# Patient Record
Sex: Female | Born: 1980 | Race: White | Hispanic: No | Marital: Married | Smoking: Never smoker
Health system: Southern US, Community
[De-identification: ages and names within clinical notes are randomized; demographics above are authoritative.]

## PROBLEM LIST (undated history)

## (undated) ENCOUNTER — Inpatient Hospital Stay (HOSPITAL_COMMUNITY): Payer: Self-pay

## (undated) DIAGNOSIS — L089 Local infection of the skin and subcutaneous tissue, unspecified: Secondary | ICD-10-CM

## (undated) DIAGNOSIS — F410 Panic disorder [episodic paroxysmal anxiety] without agoraphobia: Secondary | ICD-10-CM

## (undated) DIAGNOSIS — R030 Elevated blood-pressure reading, without diagnosis of hypertension: Secondary | ICD-10-CM

## (undated) DIAGNOSIS — M26629 Arthralgia of temporomandibular joint, unspecified side: Secondary | ICD-10-CM

## (undated) DIAGNOSIS — J0301 Acute recurrent streptococcal tonsillitis: Secondary | ICD-10-CM

## (undated) DIAGNOSIS — B958 Unspecified staphylococcus as the cause of diseases classified elsewhere: Secondary | ICD-10-CM

---
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
## 2006-08-22 ENCOUNTER — Other Ambulatory Visit: Admission: RE | Admit: 2006-08-22 | Discharge: 2006-08-22 | Payer: Self-pay | Admitting: *Deleted

## 2008-12-22 ENCOUNTER — Ambulatory Visit: Payer: Self-pay | Admitting: Sports Medicine

## 2008-12-22 DIAGNOSIS — S93499A Sprain of other ligament of unspecified ankle, initial encounter: Secondary | ICD-10-CM

## 2008-12-22 DIAGNOSIS — S96819A Strain of other specified muscles and tendons at ankle and foot level, unspecified foot, initial encounter: Secondary | ICD-10-CM

## 2008-12-22 DIAGNOSIS — M25579 Pain in unspecified ankle and joints of unspecified foot: Secondary | ICD-10-CM

## 2008-12-22 DIAGNOSIS — M2170 Unequal limb length (acquired), unspecified site: Secondary | ICD-10-CM

## 2008-12-22 DIAGNOSIS — M2140 Flat foot [pes planus] (acquired), unspecified foot: Secondary | ICD-10-CM | POA: Insufficient documentation

## 2009-01-17 ENCOUNTER — Ambulatory Visit: Payer: Self-pay | Admitting: Sports Medicine

## 2009-02-24 ENCOUNTER — Ambulatory Visit: Payer: Self-pay | Admitting: Sports Medicine

## 2009-09-09 HISTORY — PX: COLPOSCOPY: SHX161

## 2010-02-12 ENCOUNTER — Ambulatory Visit: Payer: Self-pay | Admitting: Family Medicine

## 2010-02-12 DIAGNOSIS — M76899 Other specified enthesopathies of unspecified lower limb, excluding foot: Secondary | ICD-10-CM | POA: Insufficient documentation

## 2010-10-09 NOTE — Assessment & Plan Note (Signed)
Summary: TO SEE BAILEY,U/S CALF,MC   Vital Signs:  Patient profile:   30 year old female Height:      62 inches Weight:      110 pounds BMI:     20.19 BP sitting:   110 / 76  Vitals Entered By: Lillia Pauls CMA (February 12, 2010 11:26 AM)  History of Present Illness: Went for a typical run 3 days ago. Felt a slight shearing sensation wrt medial aspect of musculotendionous junction of distal left calf. Did not experience a popping or rupture sensation. Stopped running to prevent further damage. No swelling but noted slight bruising which has resolved. No paresthesias. No prior left calf tear or procedures. No change in footwear, running course, or exercise activties.   Some pain on ambulation, which has notable decreased since her injury.   Notes some intermittent non-radiating pain along the  postero-lateral aspects of her hips. Pain develops during runs and worsens until she stops running. Accompanied by mid-buttock pain as well as runs progress. No resting pain. No prior hip injuries or procedures. No back pain.   Next marathon scheduled for October, 2011.  Physical Exam  General:  Well-developed,well-nourished,in no acute distress; alert,appropriate and cooperative throughout examination Msk:  HIPS: Full active ROM. No pain on ROM testing. Full strength except 4/5 R ER and L flex. Mildly (+) right trendelenburg. (+) ttp over right glut medius posterior to GT. No swelling. No ttp of R GT. (+) FABER on right.  LEFT CALF: No swelling, discoloration, or apparent defect. Moderate ttp medial MT junction of distal calf. No ttp elsewhere. Normal Thompson's.  ANKLES/FEET: Full active ROM with full strength throughout ROM testing. No pain throughout ROM testing.  GAIT/RUNNING FORM: Non-antalgic gait. Mild dynamic right trendelenburg on running. Extremities:  LLE <0.5 cm shorter than RLE. Neurologic:  (-) SLR bilaterally. Additional Exam:  Musculoskeletal  Ultrasound: Longitudinal and transverse views of the left achilles tendon revealed the following: Integrity: Normal Thickness: Normal Retrocalcaneal Bursa: Normal Calcaneus: Normal  No apparent defect or abnormal hypoechogenic fluid collection at MT junction where patient c/o pain. (+) dynamic ttp and mildly increased doppler flow at this site.    Impression & Recommendations:  Problem # 1:  LEG PAIN, LEFT (ICD-729.5) No evidence of calf or AT rupture. Possible mild partial tear at MT junction involving fibers or tendon sheath.  - No running. - Avoid inclines/declines. - Ambulate on level surfaces as tolerated. After pain on ambulation resolves, start calf strengthening exercises (patient instructions) as tolerated. - RTC in 3-4 wks or sooner as needed for persistent pain or any other concerns.  Problem # 2:  ENTHESOPATHY OF HIP REGION (ICD-726.5)  - Daily hip strengthening exercises, demonstrated to the patient. 120 repetitions/each daily. - RTC in 3 to 4 wks.  Complete Medication List: 1)  Voltaren 1 % Gel (Diclofenac sodium) .... Apply to affected area 2-3 times daily disp: one large tube  Patient Instructions: 1)  Do not start the following exercises until your pain goes away. 2)  Transition toward gentle heel raises on a flat surface as tolerated, up to 30 times daily. 3)  Then start pidgeon-toe heel raises on a flat surface as tolerated, up to 30 times daily. 4)  Then start toe-walks across the room, up to 10 times daily. 5)  Then start using the recumbent bike as tolerated. 6)  Then start using the elliptical as tolerated. 7)  See [place] in 3 to 4 weeks. Call us for any questions  or concerns.

## 2011-03-28 ENCOUNTER — Encounter (HOSPITAL_COMMUNITY): Payer: Self-pay | Admitting: *Deleted

## 2011-03-29 ENCOUNTER — Ambulatory Visit (HOSPITAL_COMMUNITY): Payer: 59 | Admitting: Anesthesiology

## 2011-03-29 ENCOUNTER — Encounter (HOSPITAL_COMMUNITY): Payer: Self-pay | Admitting: Anesthesiology

## 2011-03-29 ENCOUNTER — Ambulatory Visit (HOSPITAL_COMMUNITY): Payer: 59

## 2011-03-29 ENCOUNTER — Ambulatory Visit (HOSPITAL_COMMUNITY)
Admission: RE | Admit: 2011-03-29 | Discharge: 2011-03-29 | Disposition: A | Discharge status: Home / Self Care | Payer: 59 | Source: Ambulatory Visit | Attending: Obstetrics & Gynecology | Admitting: Obstetrics & Gynecology

## 2011-03-29 ENCOUNTER — Encounter (HOSPITAL_COMMUNITY)
Admission: RE | Disposition: A | Discharge status: Home / Self Care | Payer: Self-pay | Source: Ambulatory Visit | Attending: Obstetrics & Gynecology

## 2011-03-29 ENCOUNTER — Other Ambulatory Visit: Payer: Self-pay | Admitting: Obstetrics & Gynecology

## 2011-03-29 DIAGNOSIS — Z9889 Other specified postprocedural states: Secondary | ICD-10-CM

## 2011-03-29 DIAGNOSIS — O021 Missed abortion: Secondary | ICD-10-CM | POA: Insufficient documentation

## 2011-03-29 DIAGNOSIS — R634 Abnormal weight loss: Secondary | ICD-10-CM

## 2011-03-29 HISTORY — PX: DILATION AND EVACUATION: SHX1459

## 2011-03-29 LAB — CBC
HCT: 39.5 % (ref 36.0–46.0)
MCHC: 33.7 g/dL (ref 30.0–36.0)
MCV: 81.4 fL (ref 78.0–100.0)
Platelets: 225 10*3/uL (ref 150–400)
RDW: 13.2 % (ref 11.5–15.5)
WBC: 6 10*3/uL (ref 4.0–10.5)

## 2011-03-29 SURGERY — DILATION AND EVACUATION, UTERUS
Anesthesia: Monitor Anesthesia Care | Site: Uterus | Wound class: Clean Contaminated

## 2011-03-29 MED ORDER — MIDAZOLAM HCL 2 MG/2ML IJ SOLN
INTRAMUSCULAR | Status: AC
  Filled 2011-03-29: qty 2

## 2011-03-29 MED ORDER — LIDOCAINE HCL (CARDIAC) 20 MG/ML IV SOLN
INTRAVENOUS | Status: DC | PRN
  Administered 2011-03-29: 80 mg via INTRAVENOUS

## 2011-03-29 MED ORDER — CEFAZOLIN SODIUM 1-5 GM-% IV SOLN: 1.0000 g | INTRAVENOUS | Status: DC

## 2011-03-29 MED ORDER — FENTANYL CITRATE 0.05 MG/ML IJ SOLN
INTRAMUSCULAR | Status: DC | PRN
  Administered 2011-03-29: 100 ug via INTRAVENOUS

## 2011-03-29 MED ORDER — PROPOFOL 10 MG/ML IV EMUL
INTRAVENOUS | Status: AC
  Filled 2011-03-29: qty 20

## 2011-03-29 MED ORDER — CEFAZOLIN SODIUM 1-5 GM-% IV SOLN
INTRAVENOUS | Status: DC | PRN
  Administered 2011-03-29: 1 g via INTRAVENOUS

## 2011-03-29 MED ORDER — LIDOCAINE HCL (CARDIAC) 20 MG/ML IV SOLN
INTRAVENOUS | Status: AC
  Filled 2011-03-29: qty 5

## 2011-03-29 MED ORDER — FENTANYL CITRATE 0.05 MG/ML IJ SOLN
INTRAMUSCULAR | Status: AC
  Filled 2011-03-29: qty 2

## 2011-03-29 MED ORDER — LACTATED RINGERS IV SOLN
INTRAVENOUS | Status: DC | PRN
  Administered 2011-03-29 (×2): via INTRAVENOUS

## 2011-03-29 MED ORDER — PROPOFOL 10 MG/ML IV EMUL
INTRAVENOUS | Status: DC | PRN
  Administered 2011-03-29: 100 mg via INTRAVENOUS
  Administered 2011-03-29: 40 mg via INTRAVENOUS

## 2011-03-29 MED ORDER — MIDAZOLAM HCL 2 MG/2ML IJ SOLN
1.0000 mg | INTRAMUSCULAR | Status: DC | PRN
Start: 2011-03-29 — End: 2011-03-29

## 2011-03-29 MED ORDER — LIDOCAINE HCL 1 % IJ SOLN
INTRAMUSCULAR | Status: DC | PRN
  Administered 2011-03-29: 20 mL

## 2011-03-29 MED ORDER — ONDANSETRON HCL 4 MG/2ML IJ SOLN
INTRAMUSCULAR | Status: AC
  Filled 2011-03-29: qty 2

## 2011-03-29 MED ORDER — ONDANSETRON HCL 4 MG/2ML IJ SOLN
INTRAMUSCULAR | Status: DC | PRN
  Administered 2011-03-29: 4 mg via INTRAVENOUS

## 2011-03-29 MED ORDER — OXYCODONE-ACETAMINOPHEN 7.5-325 MG PO TABS: 1.0000 | ORAL_TABLET | ORAL | Status: AC | PRN

## 2011-03-29 MED ORDER — MIDAZOLAM HCL 5 MG/5ML IJ SOLN
INTRAMUSCULAR | Status: DC | PRN
  Administered 2011-03-29: 2 mg via INTRAVENOUS

## 2011-03-29 MED ORDER — LACTATED RINGERS IV SOLN: INTRAVENOUS | Status: DC

## 2011-03-29 MED ORDER — FENTANYL CITRATE 0.05 MG/ML IJ SOLN
50.0000 ug | INTRAMUSCULAR | Status: DC | PRN
  Administered 2011-03-29: 25 ug via INTRAVENOUS

## 2011-03-29 MED ORDER — LACTATED RINGERS IV SOLN
INTRAVENOUS | Status: DC
  Administered 2011-03-29: 09:00:00 via INTRAVENOUS

## 2011-03-29 SURGICAL SUPPLY — 19 items
CATH ROBINSON RED A/P 16FR (CATHETERS) ×2 IMPLANT
CLOTH BEACON ORANGE TIMEOUT ST (SAFETY) ×2 IMPLANT
DECANTER SPIKE VIAL GLASS SM (MISCELLANEOUS) ×2 IMPLANT
DRAPE UTILITY XL STRL (DRAPES) ×1 IMPLANT
GLOVE BIO SURGEON STRL SZ 6.5 (GLOVE) ×4 IMPLANT
GOWN BRE IMP SLV AUR LG STRL (GOWN DISPOSABLE) ×4 IMPLANT
KIT BERKELEY 1ST TRIMESTER 3/8 (MISCELLANEOUS) ×2 IMPLANT
NDL SPNL 22GX3.5 QUINCKE BK (NEEDLE) ×1 IMPLANT
NEEDLE SPNL 22GX3.5 QUINCKE BK (NEEDLE) ×2 IMPLANT
NS IRRIG 1000ML POUR BTL (IV SOLUTION) ×2 IMPLANT
PACK VAGINAL MINOR WOMEN LF (CUSTOM PROCEDURE TRAY) ×2 IMPLANT
PAD PREP 24X48 CUFFED NSTRL (MISCELLANEOUS) ×2 IMPLANT
SET BERKELEY SUCTION TUBING (SUCTIONS) ×2 IMPLANT
SYR CONTROL 10ML LL (SYRINGE) ×2 IMPLANT
TOWEL OR 17X24 6PK STRL BLUE (TOWEL DISPOSABLE) ×4 IMPLANT
VACURETTE 10 RIGID CVD (CANNULA) IMPLANT
VACURETTE 7MM CVD STRL WRAP (CANNULA) ×1 IMPLANT
VACURETTE 8 RIGID CVD (CANNULA) IMPLANT
VACURETTE 9 RIGID CVD (CANNULA) IMPLANT

## 2011-03-29 NOTE — Progress Notes (Signed)
03/29/2011  10:54  [place] [facility]:  GS 6+ wk, slightly irregular, small EP no FHR. [place] [facility] yesterday similar with neg doppler on EP. Had a pos uPT on June 5th.  Decision to proceed with D+E. Informed consent signed.  Genia Del MD.

## 2011-03-29 NOTE — H&P (Signed)
Rebecca Ware is an 30 y.o. female G1P0  Rh neg, Rhogam given recently at [facility] RA:  MAB 6+ wks for D+E  HPI:  Spotting x 1 a few days ago.  [place] at [facility] yesterday showing a MAB at 6+ wks, FHR absent.  Pertinent Gynecological History: Menses: regular every month without intermenstrual spotting Bleeding: 1st trim spotting x 1 Contraception: Pregnant Blood transfusions: none Sexually transmitted diseases: no past history Previous gyn procedure: none OB History: G1P0   Menstrual History:  Patient's last menstrual period was 01/17/2011.    Past Medical History  Diagnosis Date  . Anxiety 2010    took medication x 6 months    Past Surgical History  Procedure Date  . Eye surgery 2002    No family history on file.  Social History:  reports that she has never smoked. She does not have any smokeless tobacco history on file. She reports that she drinks about .6 ounces of alcohol per week. She reports that she does not use illicit drugs.  Allergies: No Known Allergies  Prescriptions prior to admission  Medication Sig Dispense Refill  . flintstones complete (FLINTSTONES) 60 MG chewable tablet Chew 2 tablets by mouth daily.        Marland Kitchen OVER THE COUNTER MEDICATION Take 1 tablet by mouth daily. Pt takes DHA gummies          Blood pressure 135/73, pulse 91, temperature 99 F (37.2 C), temperature source Oral, resp. rate 18, height 5\' 2"  (1.575 m), weight 49.896 kg (110 lb), last menstrual period 01/17/2011, SpO2 100.00%.  Ob US to confirm MAB at patient's request: pending  Results for orders placed during the hospital encounter of 03/29/11 (from the past 24 hour(s))  CBC     Status: Normal   Collection Time   03/29/11  8:52 AM      Component Value Range   WBC 6.0  4.0 - 10.5 (K/uL)   RBC 4.85  3.87 - 5.11 (MIL/uL)   Hemoglobin 13.3  12.0 - 15.0 (g/dL)   HCT 78.2  95.6 - 21.3 (%)   MCV 81.4  78.0 - 100.0 (fL)   MCH 27.4  26.0 - 34.0 (pg)   MCHC 33.7  30.0 - 36.0 (g/dL)   RDW  08.6  57.8 - 46.9 (%)   Platelets 225  150 - 400 (K/uL)    No results found.  Assessment/Plan:  MAB 6+ wks per [place] at [facility] yesterday for D+E today.  At patient's request, will confirm Dx by [place] today.  Management plan discussed if Dx confirmed.  Pt still hesitant between expectant and D+E, advantages and risks of each approach reviewed with patient.  Will decide final plan after [place] being done now.   Viki Carrera,MARIE-LYNE 03/29/2011, 9:33 AM

## 2011-03-29 NOTE — Brief Op Note (Signed)
03/29/2011  11:45 AM  PATIENT:  Rebecca Ware  30 y.o. female  PRE-OPERATIVE DIAGNOSIS:  Missed Abortion  POST-OPERATIVE DIAGNOSIS:  Same  PROCEDURE:  Procedure(s): DILATATION AND EVACUATION (D&E)  SURGEON:  Surgeon(s): Marie-Lyne Ameera Tigue  Complete Op note completed.   Genia Del MD  03/29/2011 11:46

## 2011-03-29 NOTE — Anesthesia Postprocedure Evaluation (Signed)
Vital signs stable Patient alert Pain and nausea are controlled No apparent anesthetic complications No follow up care needed 

## 2011-03-29 NOTE — Anesthesia Preprocedure Evaluation (Addendum)
Anesthesia Evaluation  Name, MR# and DOB Patient awake  General Assessment Comment  Reviewed: Allergy & Precautions, H&P  and Patient's Chart, lab work & pertinent test results  Airway Mallampati: I TM Distance: >3 FB Neck ROM: full    Dental No notable dental hx (+) Teeth Intact   Pulmonaryneg pulmonary ROS    clear to auscultation  pulmonary exam normal   Cardiovascular regular Normal   Neuro/PsychNegative Neurological ROS Negative Psych ROS  GI/Hepatic/Renal negative GI ROS, negative Liver ROS, and negative Renal ROS (+)       Endo/Other  Negative Endocrine ROS (+)   Abdominal   Musculoskeletal  Hematology negative hematology ROS (+)   Peds  Reproductive/Obstetrics (+) Pregnancy   Anesthesia Other Findings             Anesthesia Physical Anesthesia Plan  ASA: II  Anesthesia Plan: MAC   Post-op Pain Management:    Induction:   Airway Management Planned:   Additional Equipment:   Intra-op Plan:   Post-operative Plan:   Informed Consent: I have reviewed the patients History and Physical, chart, labs and discussed the procedure including the risks, benefits and alternatives for the proposed anesthesia with the patient or authorized representative who has indicated his/her understanding and acceptance.     Plan Discussed with: CRNA  Anesthesia Plan Comments:        Anesthesia Quick Evaluation

## 2011-03-29 NOTE — Anesthesia Postprocedure Evaluation (Incomplete)
  Anesthesia Post-op Note  Patient: Rebecca Ware  Procedure(s) Performed:  DILATATION AND EVACUATION (D&E)  Patient Location: {PLACES; ANE POST:19477::"PACU"}  Anesthesia Type: {PROCEDURES; ANE POST ANESTHESIA TYPE:19480}  Level of Consciousness: {FINDINGS; ANE POST LEVEL OF CONSCIOUSNESS:19484}  Airway and Oxygen Therapy: {Exam; oxygen device:30095}  Post-op Pain: {Desc; pain severity:12299}  Post-op Assessment: {ASSESSMENT; ANE BJYN:82956}  Post-op Vital Signs: {DESC; ANE POST OZHYQM:57846}  Complications: {FINDINGS; ANE POST COMPLICATIONS:19485}

## 2011-03-29 NOTE — Progress Notes (Signed)
Pt was given Rhogam in the office 03-28-11 per Dr Seymour Bars

## 2011-03-29 NOTE — Transfer of Care (Signed)
Immediate Anesthesia Transfer of Care Note  Patient: Rebecca Ware  Procedure(s) Performed:  DILATATION AND EVACUATION (D&E)  Patient Location: PACU  Anesthesia Type: MAC  Level of Consciousness: patient cooperative  Airway & Oxygen Therapy: Patient Spontanous Breathing  Post-op Assessment: Report given to PACU RN and Post -op Vital signs reviewed and stable  Post vital signs: Reviewed  Complications: No apparent anesthesia complications

## 2011-03-29 NOTE — Op Note (Signed)
03/29/2011  11:32 AM  PATIENT:  Rebecca Ware  30 y.o. female  PRE-OPERATIVE DIAGNOSIS:  Missed Abortion 6+ wks POST-OPERATIVE DIAGNOSIS:  Same  PROCEDURE:  Procedure(s): DILATATION AND EVACUATION (D&E)  SURGEON:  Surgeon(s): Marie-Lyne Manasvi Dickard  ASSISTANTS: none   ANESTHESIA:   IV sedation and paracervical block  Procedure: The patient is under IV sedation. In lithotomy position. She is prepped with Betadine and the suprapubic vulvar and vaginal areas. And draped as usual. A dose of Ancef 1 g IV was given before induction. The vaginal exam reveals a retroverted uterus about 7 cm mobile no adnexal mass. The speculum was introduced into the vagina. A paracervical block was done with Xylocaine 1%  a total of 20 cc at 4 and 8:00. The anterior lip of the soft cervix was grasped with a tenaculum. Dilatation of the cervix with Hegar dilators up to #31 without difficulty. A #7 curved suction curette was used to empty the intrauterine cavity. Products of conception corresponding to about [redacted] weeks gestation were removed. We then used a sharp curet to gently curettage the entire uterine cavity on all surfaces. The suction curette was used once more to empty any blood clots. Vaginal bleeding was minimal. The tenaculum and speculum were removed. The patient was transferred to PACU in good stable condition.  ESTIMATED BLOOD LOSS: 50 cc  No intake or output data in the 24 hours ending 03/29/11 1132   BLOOD ADMINISTERED:none   LOCAL MEDICATIONS USED:  XYLOCAINE 1% 20 CC  SPECIMEN:  Source of Specimen:  POC  DISPOSITION OF SPECIMEN:  PATHOLOGY  COUNTS:  YES    PLAN OF CARE: Transfer to PACU    Genia Del MD

## 2011-03-29 NOTE — Transfer of Care (Signed)
Immediate Anesthesia Transfer of Care Note  Patient: Rebecca Ware  Procedure(s) Performed:  DILATATION AND EVACUATION (D&E)  Patient Location: PACU  Anesthesia Type: MAC  Level of Consciousness: awake, alert  and oriented  Airway & Oxygen Therapy: Patient Spontanous Breathing  Post-op Assessment: Report given to PACU RN and Post -op Vital signs reviewed and stable  Post vital signs: Reviewed and stable  Complications: No apparent anesthesia complications

## 2011-04-24 ENCOUNTER — Encounter (HOSPITAL_COMMUNITY): Payer: Self-pay | Admitting: Obstetrics & Gynecology

## 2011-07-05 ENCOUNTER — Other Ambulatory Visit: Payer: Self-pay | Admitting: Obstetrics and Gynecology

## 2011-07-07 NOTE — H&P (Signed)
Rebecca Ware, TWOMBLY               ACCOUNT NO.:  [id number]  MEDICAL RECORD NO.:  [id number]  LOCATION:  [facility]                         FACILITY:  [facility]  PHYSICIAN:  Lenoard Aden, M.D.DATE OF BIRTH:  12/20/1980  DATE OF ADMISSION:  07/04/2011 DATE OF DISCHARGE:                             HISTORY & PHYSICAL   CHIEF COMPLAINT:  Missed abortion with recurrent pregnancy loss.  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  ALLERGIES:  SHE HAS NO KNOWN DRUG ALLERGIES.  MEDICATIONS:  Prenatal vitamins.  SOCIAL HISTORY:  Noncontributory.  Nonsmoker and nondrinker.  Denies domestic or physical violence.  FAMILY HISTORY:  Depression and chronic hypertension.  SURGICAL HISTORY:  Remarkable for D and E in July 2012.  OBSTETRIC HISTORY:  Remarkable for 2 first trimester consecutive pregnancy losses.  PHYSICAL EXAMINATION:  GENERAL:  She is a well-developed, well-nourished white female in no acute distress. HEENT:  Normal. NECK:  Supple.  Full range of motion. LUNGS:  Clear. HEART:  Regular rhythm. ABDOMEN:  Soft and nontender. GU:  Pelvic exam reveals a retroflexed uterus and no adnexal masses. EXTREMITIES:  No cords. NEUROLOGIC:  Exam is nonfocal. SKIN:  Intact.  IMPRESSION:  Missed abortion at [redacted] weeks gestation with recurrent pregnancy loss noted.  PLAN:  Suction D and E.  Tissue for chromosome.  Risks of anesthesia, infection, bleeding, injury to abdominal organs with possible uterine perforation, and need for intervention is discussed.  The patient acknowledges and wishes to proceed.     Lenoard Aden, M.D.     RJT/MEDQ  D:  07/07/2011  T:  07/07/2011  Job:  [id number]

## 2011-07-08 ENCOUNTER — Encounter (HOSPITAL_COMMUNITY): Payer: Self-pay | Admitting: Anesthesiology

## 2011-07-08 ENCOUNTER — Ambulatory Visit (HOSPITAL_COMMUNITY)
Admission: RE | Admit: 2011-07-08 | Discharge: 2011-07-08 | Disposition: A | Discharge status: Home / Self Care | Payer: 59 | Source: Ambulatory Visit | Attending: Obstetrics and Gynecology | Admitting: Obstetrics and Gynecology

## 2011-07-08 ENCOUNTER — Encounter (HOSPITAL_COMMUNITY): Payer: Self-pay | Admitting: *Deleted

## 2011-07-08 ENCOUNTER — Encounter (HOSPITAL_COMMUNITY)
Admission: RE | Disposition: A | Discharge status: Home / Self Care | Payer: Self-pay | Source: Ambulatory Visit | Attending: Obstetrics and Gynecology

## 2011-07-08 ENCOUNTER — Other Ambulatory Visit: Payer: Self-pay | Admitting: Obstetrics and Gynecology

## 2011-07-08 ENCOUNTER — Ambulatory Visit (HOSPITAL_COMMUNITY): Payer: 59 | Admitting: Anesthesiology

## 2011-07-08 DIAGNOSIS — O021 Missed abortion: Secondary | ICD-10-CM | POA: Insufficient documentation

## 2011-07-08 HISTORY — PX: DILATION AND EVACUATION: SHX1459

## 2011-07-08 LAB — CBC
MCHC: 34 g/dL (ref 30.0–36.0)
Platelets: 228 10*3/uL (ref 150–400)
RDW: 12.8 % (ref 11.5–15.5)
WBC: 5.7 10*3/uL (ref 4.0–10.5)

## 2011-07-08 SURGERY — DILATION AND EVACUATION, UTERUS
Anesthesia: Monitor Anesthesia Care | Site: Uterus | Wound class: Clean Contaminated

## 2011-07-08 MED ORDER — ONDANSETRON HCL 4 MG/2ML IJ SOLN
INTRAMUSCULAR | Status: DC | PRN
  Administered 2011-07-08: 4 mg via INTRAVENOUS

## 2011-07-08 MED ORDER — ONDANSETRON HCL 4 MG/2ML IJ SOLN
INTRAMUSCULAR | Status: AC
  Filled 2011-07-08: qty 2

## 2011-07-08 MED ORDER — BUPIVACAINE HCL (PF) 0.25 % IJ SOLN
INTRAMUSCULAR | Status: DC | PRN
  Administered 2011-07-08: 20 mL

## 2011-07-08 MED ORDER — PROPOFOL 10 MG/ML IV EMUL
INTRAVENOUS | Status: DC | PRN
  Administered 2011-07-08 (×7): 10 mg via INTRAVENOUS
  Administered 2011-07-08: 20 mg via INTRAVENOUS
  Administered 2011-07-08 (×5): 10 mg via INTRAVENOUS

## 2011-07-08 MED ORDER — FENTANYL CITRATE 0.05 MG/ML IJ SOLN
INTRAMUSCULAR | Status: DC | PRN
  Administered 2011-07-08: 50 ug via INTRAVENOUS
  Administered 2011-07-08: 100 ug via INTRAVENOUS
  Administered 2011-07-08: 50 ug via INTRAVENOUS

## 2011-07-08 MED ORDER — FENTANYL CITRATE 0.05 MG/ML IJ SOLN: 25.0000 ug | INTRAMUSCULAR | Status: DC | PRN

## 2011-07-08 MED ORDER — DEXAMETHASONE SODIUM PHOSPHATE 10 MG/ML IJ SOLN
INTRAMUSCULAR | Status: AC
  Filled 2011-07-08: qty 1

## 2011-07-08 MED ORDER — PROMETHAZINE HCL 25 MG/ML IJ SOLN: 6.2500 mg | INTRAMUSCULAR | Status: DC | PRN

## 2011-07-08 MED ORDER — PROPOFOL 10 MG/ML IV EMUL
INTRAVENOUS | Status: AC
  Filled 2011-07-08: qty 20

## 2011-07-08 MED ORDER — KETOROLAC TROMETHAMINE 30 MG/ML IJ SOLN
INTRAMUSCULAR | Status: DC | PRN
  Administered 2011-07-08: 30 mg via INTRAVENOUS

## 2011-07-08 MED ORDER — MIDAZOLAM HCL 2 MG/2ML IJ SOLN
INTRAMUSCULAR | Status: AC
  Filled 2011-07-08: qty 2

## 2011-07-08 MED ORDER — DEXAMETHASONE SODIUM PHOSPHATE 10 MG/ML IJ SOLN
INTRAMUSCULAR | Status: DC | PRN
  Administered 2011-07-08: 10 mg via INTRAVENOUS

## 2011-07-08 MED ORDER — MEPERIDINE HCL 25 MG/ML IJ SOLN: 6.2500 mg | INTRAMUSCULAR | Status: DC | PRN

## 2011-07-08 MED ORDER — LACTATED RINGERS IV SOLN
INTRAVENOUS | Status: DC
  Administered 2011-07-08 (×2): via INTRAVENOUS

## 2011-07-08 MED ORDER — FENTANYL CITRATE 0.05 MG/ML IJ SOLN
INTRAMUSCULAR | Status: AC
  Filled 2011-07-08: qty 5

## 2011-07-08 MED ORDER — MIDAZOLAM HCL 5 MG/5ML IJ SOLN
INTRAMUSCULAR | Status: DC | PRN
  Administered 2011-07-08: 2 mg via INTRAVENOUS

## 2011-07-08 MED ORDER — LIDOCAINE HCL (CARDIAC) 20 MG/ML IV SOLN
INTRAVENOUS | Status: AC
  Filled 2011-07-08: qty 5

## 2011-07-08 MED ORDER — LIDOCAINE HCL (CARDIAC) 20 MG/ML IV SOLN
INTRAVENOUS | Status: DC | PRN
  Administered 2011-07-08: 60 mg via INTRAVENOUS

## 2011-07-08 SURGICAL SUPPLY — 21 items
CATH ROBINSON RED A/P 16FR (CATHETERS) ×2 IMPLANT
CLOTH BEACON ORANGE TIMEOUT ST (SAFETY) ×2 IMPLANT
DECANTER SPIKE VIAL GLASS SM (MISCELLANEOUS) ×2 IMPLANT
GLOVE BIO SURGEON STRL SZ7.5 (GLOVE) ×4 IMPLANT
GOWN PREVENTION PLUS LG XLONG (DISPOSABLE) ×2 IMPLANT
GOWN PREVENTION PLUS XLARGE (GOWN DISPOSABLE) ×2 IMPLANT
KIT BERKELEY 1ST TRIMESTER 3/8 (MISCELLANEOUS) ×2 IMPLANT
NDL SPNL 22GX3.5 QUINCKE BK (NEEDLE) ×1 IMPLANT
NEEDLE SPNL 22GX3.5 QUINCKE BK (NEEDLE) ×2 IMPLANT
NS IRRIG 1000ML POUR BTL (IV SOLUTION) ×2 IMPLANT
PACK VAGINAL MINOR WOMEN LF (CUSTOM PROCEDURE TRAY) ×2 IMPLANT
PAD PREP 24X48 CUFFED NSTRL (MISCELLANEOUS) ×2 IMPLANT
SET BERKELEY SUCTION TUBING (SUCTIONS) ×2 IMPLANT
SYR CONTROL 10ML LL (SYRINGE) ×2 IMPLANT
TOWEL OR 17X24 6PK STRL BLUE (TOWEL DISPOSABLE) ×4 IMPLANT
VACURETTE 10 RIGID CVD (CANNULA) IMPLANT
VACURETTE 7MM CVD STRL WRAP (CANNULA) ×1 IMPLANT
VACURETTE 7MM STRAIGHT (CANNULA) IMPLANT
VACURETTE 8 RIGID CVD (CANNULA) IMPLANT
VACURETTE 9 RIGID CVD (CANNULA) IMPLANT
VACURRETTE 7MM STRAIGHT (CANNULA)

## 2011-07-08 NOTE — Op Note (Signed)
07/08/2011  1:43 PM  PATIENT:  Alejandro Mulling  30 y.o. female  PRE-OPERATIVE DIAGNOSIS:  Missed Abortion Recurrent Pregnancy Loss  POST-OPERATIVE DIAGNOSIS:  Missed Abortion Recurrent Pregnancy Loss  PROCEDURE:  Procedure(s): DILATATION AND EVACUATION (D&E)  SURGEON:  Surgeon(s): Lenoard Aden, MD  ASSISTANTS: none   ANESTHESIA:   local and IV sedation  ESTIMATED BLOOD LOSS: * No blood loss amount entered *   DRAINS: none   LOCAL MEDICATIONS USED:  MARCAINE 20CC  SPECIMEN:  Source of Specimen:  POC  DISPOSITION OF SPECIMEN:  PATHOLOGY and for chromosomal analysis  COUNTS:  YES  DICTATION #: [id number]  PLAN OF CARE: DC home  PATIENT DISPOSITION:  PACU - hemodynamically stable.

## 2011-07-08 NOTE — Progress Notes (Signed)
Per dr Billy Coast no rhogam needed for patient. Pt received in office.

## 2011-07-08 NOTE — Transfer of Care (Signed)
Immediate Anesthesia Transfer of Care Note  Patient: Rebecca Ware  Procedure(s) Performed:  DILATATION AND EVACUATION (D&E)  Patient Location: PACU  Anesthesia Type: MAC  Level of Consciousness: awake and alert   Airway & Oxygen Therapy: Patient Spontanous Breathing  Post-op Assessment: Report given to PACU RN and Post -op Vital signs reviewed and stable  Post vital signs: Reviewed and stable  Complications: No apparent anesthesia complications

## 2011-07-08 NOTE — Anesthesia Preprocedure Evaluation (Signed)
Anesthesia Evaluation  Patient identified by MRN, date of birth, ID band Patient awake  General Assessment Comment  Reviewed: Allergy & Precautions, H&P , NPO status , Patient's Chart, lab work & pertinent test results  Airway Mallampati: II TM Distance: >3 FB Neck ROM: Full    Dental No notable dental hx. (+) Teeth Intact   Pulmonary  clear to auscultation  Pulmonary exam normal       Cardiovascular Regular Normal    Neuro/Psych PSYCHIATRIC DISORDERS Anxiety Negative Neurological ROS     GI/Hepatic negative GI ROS Neg liver ROS    Endo/Other  Negative Endocrine ROS  Renal/GU negative Renal ROS  Genitourinary negative   Musculoskeletal negative musculoskeletal ROS (+)   Abdominal   Peds  Hematology negative hematology ROS (+)   Anesthesia Other Findings   Reproductive/Obstetrics (+) Pregnancy                           Anesthesia Physical Anesthesia Plan  ASA: II  Anesthesia Plan: MAC   Post-op Pain Management:    Induction: Intravenous  Airway Management Planned: Mask and Natural Airway  Additional Equipment:   Intra-op Plan:   Post-operative Plan:   Informed Consent: I have reviewed the patients History and Physical, chart, labs and discussed the procedure including the risks, benefits and alternatives for the proposed anesthesia with the patient or authorized representative who has indicated his/her understanding and acceptance.   Dental advisory given  Plan Discussed with: CRNA, Anesthesiologist and Surgeon  Anesthesia Plan Comments:         Anesthesia Quick Evaluation

## 2011-07-08 NOTE — Anesthesia Postprocedure Evaluation (Signed)
  Anesthesia Post-op Note  Patient: Rebecca Ware  Procedure(s) Performed:  DILATATION AND EVACUATION (D&E)  Patient Location: PACU  Anesthesia Type: MAC  Level of Consciousness: awake, alert  and oriented  Airway and Oxygen Therapy: Patient Spontanous Breathing  Post-op Pain: none  Post-op Assessment: Post-op Vital signs reviewed, Patient's Cardiovascular Status Stable, Respiratory Function Stable, Patent Airway, No signs of Nausea or vomiting and Pain level controlled  Post-op Vital Signs: Reviewed and stable  Complications: No apparent anesthesia complications

## 2011-07-08 NOTE — Progress Notes (Signed)
  Consent done. Update done. Pt examined.

## 2011-07-09 ENCOUNTER — Encounter (HOSPITAL_COMMUNITY): Payer: Self-pay | Admitting: Obstetrics and Gynecology

## 2011-07-09 NOTE — Op Note (Signed)
[name], Rebecca Ware               ACCOUNT NO.:  [id number]  MEDICAL RECORD NO.:  [id number]  LOCATION:  [facility]                          FACILITY:  [facility]  PHYSICIAN:  Lenoard Aden, M.D.DATE OF BIRTH:  1981/05/05  DATE OF PROCEDURE:  07/08/2011 DATE OF DISCHARGE:                              OPERATIVE REPORT   DESCRIPTION OF PROCEDURE:  After being apprised of the risks of anesthesia, infection, bleeding, uterine perforation, possible need for repair, delayed versus immediate complications to include bowel and bladder injury, possible need for repair, the patient was brought to the operating room and she was administered IV sedation without difficulty, prepped and draped in the usual sterile fashion.  Catheterized until the bladder was empty.  Exam under anesthesia revealed a bulky retroflexed uterus and no adnexal masses.  After achieving adequate anesthesia, dilute Marcaine solution placed in standard paracervical block.  Good hemostasis was noted.  At this time, cervix was easily dilated up to a #21 Pratt dilator, and a 7 mm curved suction curette placed. Visualization reveals aspiration, products of conception.  Blunt curettage in a 4 quadrant method reveals cavity to be empty.  Repeat suction confirms good hemostasis noted.  All instruments were removed. The patient tolerated the procedure well and was transferred to recovery in good condition.  Tissue specimen was obtained and sent for chromosomal analysis.     Lenoard Aden, M.D.     RJT/MEDQ  D:  07/08/2011  T:  07/09/2011  Job:  [id number]

## 2011-07-30 LAB — CHROMOSOME STD, POC(TISSUE)-NCBH

## 2011-09-10 NOTE — L&D Delivery Note (Signed)
Delivery Note At 5:23 PM a viable and healthy female was delivered via Vaginal, Spontaneous Delivery (Presentation: Right Occiput Anterior).  APGAR: 9, 10; weight pending .   Placenta status: manual ,intact .  Cord: 3 vessels with the following complications: none to path.  Cord pH: na Peds in attendance due to prematurity.  Anesthesia: Epidural  Episiotomy: None Lacerations: 2nd degree Suture Repair: 2.0 vicryl rapide Est. Blood Loss (mL): 300  Mom to postpartum.  Baby to NICU.  Jahzeel Poythress J 08/23/2012, 5:40 PM

## 2012-03-11 LAB — OB RESULTS CONSOLE RUBELLA ANTIBODY, IGM: Rubella: IMMUNE

## 2012-03-11 LAB — OB RESULTS CONSOLE RPR: RPR: NONREACTIVE

## 2012-03-11 LAB — OB RESULTS CONSOLE HIV ANTIBODY (ROUTINE TESTING): HIV: NONREACTIVE

## 2012-03-11 LAB — OB RESULTS CONSOLE HEPATITIS B SURFACE ANTIGEN: Hepatitis B Surface Ag: NEGATIVE

## 2012-03-18 LAB — OB RESULTS CONSOLE GC/CHLAMYDIA
Chlamydia: NEGATIVE
Gonorrhea: NEGATIVE

## 2012-05-09 ENCOUNTER — Inpatient Hospital Stay (HOSPITAL_COMMUNITY)
Admission: AD | Admit: 2012-05-09 | Discharge: 2012-05-09 | Disposition: A | Discharge status: Home / Self Care | Payer: 59 | Source: Ambulatory Visit | Attending: Obstetrics & Gynecology | Admitting: Obstetrics & Gynecology

## 2012-05-09 ENCOUNTER — Encounter (HOSPITAL_COMMUNITY): Payer: Self-pay | Admitting: *Deleted

## 2012-05-09 DIAGNOSIS — O99891 Other specified diseases and conditions complicating pregnancy: Secondary | ICD-10-CM | POA: Insufficient documentation

## 2012-05-09 DIAGNOSIS — R109 Unspecified abdominal pain: Secondary | ICD-10-CM | POA: Insufficient documentation

## 2012-05-09 DIAGNOSIS — O26899 Other specified pregnancy related conditions, unspecified trimester: Secondary | ICD-10-CM

## 2012-05-09 LAB — URINALYSIS, ROUTINE W REFLEX MICROSCOPIC
Glucose, UA: NEGATIVE mg/dL
Hgb urine dipstick: NEGATIVE
Specific Gravity, Urine: 1.02 (ref 1.005–1.030)
Urobilinogen, UA: 0.2 mg/dL (ref 0.0–1.0)
pH: 6 (ref 5.0–8.0)

## 2012-05-09 LAB — URINE MICROSCOPIC-ADD ON

## 2012-05-09 NOTE — MAU Note (Signed)
Pt reports having lower abd pain/crampiness that started yesterday.  Today pain more with movement.

## 2012-05-09 NOTE — MAU Provider Note (Signed)
  History     CSN: [id number]  Arrival date and time: 05/09/12 0759 Call to provider at [phone] Provider here to examine patient at 0908  Chief Complaint  Patient presents with  . Abdominal Pain   HPI Hx of two miscarriages Sharp side pain right with movement and position changes x 2 days now with constant aching in right side in single location where sharp pains felt  No cramping or pelvic pain or pressure No bleeding - low lying placenta + FM  No nausea or vomiting  BM yesterday and this am - normal  Past Medical History  Diagnosis Date  . Anxiety 2010    took medication x 6 months    Past Surgical History  Procedure Date  . Eye surgery 2002  . Dilation and evacuation 03/29/2011    Procedure: DILATATION AND EVACUATION (D&E);  Surgeon: Genia Del;  Location: [facility];  Service: Gynecology;  Laterality: N/A;  . Dilation and evacuation 07/08/2011    Procedure: DILATATION AND EVACUATION (D&E);  Surgeon: Lenoard Aden, MD;  Location: [facility];  Service: Gynecology;  Laterality: N/A;    No family history on file.  History  Substance Use Topics  . Smoking status: Never Smoker   . Smokeless tobacco: Not on file  . Alcohol Use: 0.6 oz/week    1 Glasses of wine per week     nothing since pregnancy    Allergies: No Known Allergies  Prescriptions prior to admission  Medication Sig Dispense Refill  . aspirin 81 MG tablet Take 81 mg by mouth daily.      . flintstones complete (FLINTSTONES) 60 MG chewable tablet Chew 2 tablets by mouth daily.        . folic acid (FOLVITE) 1 MG tablet Take 4 mg by mouth daily.      Marland Kitchen OVER THE COUNTER MEDICATION Take 1 tablet by mouth daily. Pt takes DHA gummies         ROS Physical Exam   Blood pressure 117/82, pulse 82, temperature 97.1 F (36.2 C), temperature source Oral, resp. rate 18, height 5' 1.25" (1.556 m), weight 53.978 kg (119 lb), last menstrual period 12/29/2010.  Physical Exam  Alert and oriented FHR : 148 Abdomen:  soft and non-tender with active BS Uterus 2 cm below umbilicus - likely transverse fetal lie / non-tender / no point tenderness on right No guarding or rebound  Defer pelvic exam  Urinalysis - negative MAU Course  Procedures - none  Assessment and Plan  19 weeks with ligament pain Fetal positioning and maternal activity triggers  Expectant management and supportive care       Water or tub soaks       Ok warm topical heat 10-15 minutes / hour for pain       Will resolve  - may reoccur throughout remained for second trimester  Call if any cramping or pelvic pain / pressure or any bleeding  Keep F/U apt with Dr Billy Coast on Sept 23rd  Rebecca Ware 05/09/2012, 9:22 AM

## 2012-08-15 ENCOUNTER — Observation Stay (HOSPITAL_COMMUNITY)
Admission: AD | Admit: 2012-08-15 | Discharge: 2012-08-18 | Disposition: A | Discharge status: Home / Self Care | Payer: 59 | Source: Ambulatory Visit | Attending: Obstetrics and Gynecology | Admitting: Obstetrics and Gynecology

## 2012-08-15 DIAGNOSIS — M549 Dorsalgia, unspecified: Secondary | ICD-10-CM | POA: Insufficient documentation

## 2012-08-15 DIAGNOSIS — R109 Unspecified abdominal pain: Secondary | ICD-10-CM | POA: Insufficient documentation

## 2012-08-15 DIAGNOSIS — O4700 False labor before 37 completed weeks of gestation, unspecified trimester: Principal | ICD-10-CM | POA: Insufficient documentation

## 2012-08-15 NOTE — MAU Note (Signed)
Lost my mucous plug today. Having pelvic pressure. Fell Thurs night. Was walking dog and tripped forward and fell on hands and knees. Did not hit abdomen. Baby moving but not like usual. Leaked fld this am. Having back pain

## 2012-08-16 ENCOUNTER — Inpatient Hospital Stay (HOSPITAL_COMMUNITY): Payer: 59

## 2012-08-16 ENCOUNTER — Encounter (HOSPITAL_COMMUNITY): Payer: Self-pay

## 2012-08-16 LAB — URINALYSIS, ROUTINE W REFLEX MICROSCOPIC
Glucose, UA: NEGATIVE mg/dL
Ketones, ur: NEGATIVE mg/dL
Leukocytes, UA: NEGATIVE
Protein, ur: NEGATIVE mg/dL
Urobilinogen, UA: 0.2 mg/dL (ref 0.0–1.0)

## 2012-08-16 LAB — CBC
HCT: 35.2 % — ABNORMAL LOW (ref 36.0–46.0)
MCHC: 33.8 g/dL (ref 30.0–36.0)
MCV: 82.8 fL (ref 78.0–100.0)
RDW: 12.8 % (ref 11.5–15.5)

## 2012-08-16 LAB — WET PREP, GENITAL
Trich, Wet Prep: NONE SEEN
Yeast Wet Prep HPF POC: NONE SEEN

## 2012-08-16 LAB — OB RESULTS CONSOLE GBS: GBS: NEGATIVE

## 2012-08-16 LAB — AMNISURE RUPTURE OF MEMBRANE (ROM) NOT AT ARMC: Amnisure ROM: NEGATIVE

## 2012-08-16 LAB — URINE MICROSCOPIC-ADD ON

## 2012-08-16 MED ORDER — BETAMETHASONE SOD PHOS & ACET 6 (3-3) MG/ML IJ SUSP
12.0000 mg | Freq: Once | INTRAMUSCULAR | Status: AC
  Administered 2012-08-17: 12 mg via INTRAMUSCULAR
  Filled 2012-08-16: qty 2

## 2012-08-16 MED ORDER — LACTATED RINGERS IV SOLN
INTRAVENOUS | Status: DC
  Administered 2012-08-16 – 2012-08-17 (×2): via INTRAVENOUS

## 2012-08-16 MED ORDER — NIFEDIPINE 10 MG PO CAPS
10.0000 mg | ORAL_CAPSULE | ORAL | Status: DC | PRN
  Administered 2012-08-16 – 2012-08-17 (×4): 10 mg via ORAL
  Filled 2012-08-16 (×4): qty 1

## 2012-08-16 MED ORDER — NIFEDIPINE 10 MG PO CAPS
10.0000 mg | ORAL_CAPSULE | Freq: Three times a day (TID) | ORAL | Status: DC
  Administered 2012-08-16: 10 mg via ORAL
  Filled 2012-08-16: qty 1

## 2012-08-16 MED ORDER — BETAMETHASONE SOD PHOS & ACET 6 (3-3) MG/ML IJ SUSP
12.0000 mg | Freq: Once | INTRAMUSCULAR | Status: AC
  Administered 2012-08-16: 12 mg via INTRAMUSCULAR
  Filled 2012-08-16: qty 2

## 2012-08-16 MED ORDER — CALCIUM CARBONATE ANTACID 500 MG PO CHEW
2.0000 | CHEWABLE_TABLET | ORAL | Status: DC | PRN
  Filled 2012-08-16: qty 2

## 2012-08-16 MED ORDER — ACETAMINOPHEN 325 MG PO TABS: 650.0000 mg | ORAL_TABLET | ORAL | Status: DC | PRN

## 2012-08-16 MED ORDER — PRENATAL MULTIVITAMIN CH
1.0000 | ORAL_TABLET | Freq: Every day | ORAL | Status: DC
  Administered 2012-08-16 – 2012-08-17 (×2): 1 via ORAL
  Filled 2012-08-16 (×3): qty 1

## 2012-08-16 MED ORDER — SODIUM CHLORIDE 0.9 % IV SOLN
1.0000 g | Freq: Four times a day (QID) | INTRAVENOUS | Status: DC
  Administered 2012-08-16 – 2012-08-18 (×9): 1 g via INTRAVENOUS
  Filled 2012-08-16 (×10): qty 1000

## 2012-08-16 NOTE — Progress Notes (Signed)
Patient ID: Rebecca Ware, female   DOB: 10-13-1980, 31 y.o.   MRN: [id number] HD #1 33 weeks PTL  S: No complaints Denies contractions. No ROM, Pos FM No CP or SOB  O: Blood pressure 102/59, pulse 62, temperature 98.4 F (36.9 C), temperature source Oral, resp. rate 18, height 5\' 2"  (1.575 m), weight 62.143 kg (137 lb), last menstrual period 12/29/2010.  Lungs:CTA CV: RRR Abd: Gravid NT No CVAT EXT: no cords Pelvic: closed/80/0 Skin : intact Neuro: nonfocal  FHR- 130-150 , BTBV 5-25, no decels Irregular contractions with UI noted Category I tracing  A: 33 weeks PTL with positive FFN History of LGA  P: Procardia BMZ series Will do complete OB sono NICU consult

## 2012-08-16 NOTE — MAU Provider Note (Signed)
History    Patient has presented at [redacted]w[redacted]d G3P0020. Hx of SAB x 2 and Blood Group B neg - has had Rhogam at 28 weeks. History of having lower abdominal pain since last Tuesday. Had been drinking plenty of clear fluids and was unconcerned. On Thursday 08/13/12 the patient tripped on the pavement and fell to knees and hands without falling on abdomen. Scuffed Left knee. States that she has had decreased fetal movement since this event together with lower back and lower abdominal pain.. Today she had complained of have brown tinged mucoid vaginal mucus and passed her mucus plug. Continues to cramp amd have lower back pain. Presented to MAU 23.51 08/15/12 for evaulation and had been in contact with Dr. Billy Coast who advised her to go home but if she wanted to stay for monitoring he was agreeable to being seen. CNM D. Connye Burkitt called to evalauate. Patient is now contracting 1: 2 - 3 mins with uterine irritability while on EFM and baseline 135 bpm Cat 1. CSN: [id number]  Arrival date and time: 08/15/12 2344   First Provider Initiated Contact with Patient 08/16/12 0149      Chief Complaint  Patient presents with  . Back Pain  . Decreased Fetal Movement  . Fall  . Rupture of Membranes   Back Pain  Fall    OB History    Grav Para Term Preterm Abortions TAB SAB Ect Mult Living   3 0   2  2   0      Past Medical History  Diagnosis Date  . Anxiety 2010    took medication x 6 months  . Abnormal Pap smear 2011    colpo. repeat WNL  . MTHFR mutation     Past Surgical History  Procedure Date  . Eye surgery 2002  . Dilation and evacuation 03/29/2011    Procedure: DILATATION AND EVACUATION (D&E);  Surgeon: Genia Del;  Location: [facility];  Service: Gynecology;  Laterality: N/A;  . Dilation and evacuation 07/08/2011    Procedure: DILATATION AND EVACUATION (D&E);  Surgeon: Lenoard Aden, MD;  Location: [facility];  Service: Gynecology;  Laterality: N/A;  . Colposcopy 2011    Family History   Problem Relation Age of Onset  . Other Neg Hx   . Depression Mother   . Hypertension Mother   . Hypertension Father   . Depression Brother     History  Substance Use Topics  . Smoking status: Never Smoker   . Smokeless tobacco: Not on file  . Alcohol Use: No     Comment: nothing since pregnancy    Allergies: No Known Allergies  Prescriptions prior to admission  Medication Sig Dispense Refill  . aspirin 81 MG tablet Take 81 mg by mouth daily.      . flintstones complete (FLINTSTONES) 60 MG chewable tablet Chew 2 tablets by mouth daily.        . folic acid (FOLVITE) 1 MG tablet Take 4 mg by mouth daily.      Marland Kitchen OVER THE COUNTER MEDICATION Take 1 tablet by mouth daily. Pt takes DHA gummies         Review of Systems  Constitutional: Negative.   HENT: Negative.   Eyes: Negative.   Respiratory: Negative.   Cardiovascular: Negative.   Gastrointestinal: Negative.   Genitourinary: Negative.   Musculoskeletal: Positive for back pain.       Lower back pain c/w with uterine irritability.  Skin: Negative.  Small abrasive Lt knee from fall on 08/13/12.  Neurological: Negative.   Endo/Heme/Allergies: Negative.   Psychiatric/Behavioral: The patient is nervous/anxious.        Anxious as Hx of SAB x 2   Physical Exam   Blood pressure 119/86, pulse 79, temperature 98 F (36.7 C), temperature source Oral, resp. rate 20, height 5\' 2"  (1.575 m), weight 62.143 kg (137 lb), last menstrual period 12/29/2010.  Physical Exam  Constitutional: She is oriented to person, place, and time. She appears well-developed and well-nourished.  Eyes: Conjunctivae normal and EOM are normal. Pupils are equal, round, and reactive to light.  Neck: Normal range of motion. Neck supple.  Cardiovascular: Normal rate, regular rhythm and normal heart sounds.   Respiratory: Effort normal and breath sounds normal.  GI: Soft. Bowel sounds are normal.  Genitourinary: Vaginal discharge found.       Uterus:  Uterine irritability and irregular PTL contractions. Vaginal secretions: brown mucoid vaginal secretions Cx evaluation:1.5 cms/90%/-1, soft, Vx. presentation, longitudinal lie.  Neurological: She is alert and oriented to person, place, and time. She has normal reflexes.  Skin: Skin is warm and dry.  Psychiatric: She has a normal mood and affect.    MAU Course  Procedures Speculum Examination Amni-sure U/a Wet prep FFN IV Fluid bolus 500 mls KB Fibrinogen CBC.   Assessment and Plan  Speculum Examination: Cx: 1.5/90%/ -1, Posterior, soft, Vx. Amni-sure: neg Wet Prep:neg IV hydration Procardia 10 mg po x 1 FFN positive BMTZ x 1 Collaboration with Dr. Billy Coast - Admit for management of Threatened PT Labor. GBS: pending IV ABX - Ampicillin -  PTL until GBS resulted.   Lorie Melichar, CNM. 08/16/2012, 2:35 AM

## 2012-08-16 NOTE — MAU Note (Addendum)
Dr. Billy Coast notified of patients arrival to MAU and reasons for being seen. States doesn't think patient needs to be seen tonight and can leave without being seen. Dr. Billy Coast notified of fall on Thursday and decreased fetal movement. States up to patient if she would like to be seen, and call the midwife to see patient.  Patient decided to stay and be seen. Octavio Manns CNM notified.

## 2012-08-16 NOTE — H&P (Signed)
Rebecca Ware is a 31 y.o. female, G3P0020 at 33w, presenting for evaluation of back pain and lower abdominal pain. Also history of fall 08/13/12.   Patient Active Problem List  Diagnosis  . ENTHESOPATHY OF HIP REGION  . Pain in soft tissues of limb  . PES PLANUS  . UNEQUAL LEG LENGTH  . ACHILLES TENDON TEAR    History of present pregnancy: Patient entered care at 8 weeks.   EDC of 10/05/11 was established by LMP and Sono   Anatomy scan:  18weeks, with normal findings and an low lying placenta.   Additional [place] evaluations:  26 weeks Fundus LGA - c/w with Sono  Significant prenatal events:  B neg - Rhogam given at 28 weeks    Planned F/U Sono for growth at 34 weeks Last evaluation:  12//7/13 in MAU - Threatened PTL with Cx effecement.  OB History    Grav Para Term Preterm Abortions TAB SAB Ect Mult Living   3 0   2  2   0     Past Medical History  Diagnosis Date  . Anxiety 2010    took medication x 6 months  . Abnormal Pap smear 2011    colpo. repeat WNL  . MTHFR mutation    Past Surgical History  Procedure Date  . Eye surgery 2002  . Dilation and evacuation 03/29/2011    Procedure: DILATATION AND EVACUATION (D&E);  Surgeon: Genia Del;  Location: [facility];  Service: Gynecology;  Laterality: N/A;  . Dilation and evacuation 07/08/2011    Procedure: DILATATION AND EVACUATION (D&E);  Surgeon: Lenoard Aden, MD;  Location: [facility];  Service: Gynecology;  Laterality: N/A;  . Colposcopy 2011   Family History: family history includes Depression in her brother and mother and Hypertension in her father and mother.  There is no history of Other. Social History:  reports that she has never smoked. She does not have any smokeless tobacco history on file. She reports that she does not drink alcohol or use illicit drugs.   Prenatal Transfer Tool  Maternal Diabetes: No Genetic Screening: Normal Maternal Ultrasounds/Referrals: Normal Fetal Ultrasounds or other Referrals:  None,  Other: f/u Sono for LGA surveillance at 34 weeks and had low lying placenta Maternal Substance Abuse:  No Significant Maternal Medications:  None Significant Maternal Lab Results: None    ROS  No Known Allergies  Dilation: 1.5 Effacement (%): 90 Station: -1 Exam by:: D June Rode CNM Blood pressure 102/59, pulse 62, temperature 98.4 F (36.9 C), temperature source Oral, resp. rate 18, height 5\' 2"  (1.575 m), weight 62.143 kg (137 lb), last menstrual period 12/29/2010.  Chest clear Heart RRR without murmur Abd gravid, NT, FH 33 weeks Pelvic: unproven Ext: normal  FHR: 145 bpm, baseline Cat 1 UCs:  Uterine irritability and irregular contractions.  Prenatal labs: ABO, Rh:  B neg Antibody:  neg Rubella:   Immune RPR:   N/R HBsAg:  neg  HIV:   neg GBS:  pending - GBS  Collected 08/16/12 Sickle cell/Hgb electrophoresis:  Not complicated Pap: Normal GC:  Neg Chlamydia:  Neg Genetic screenings:  Normal Glucola: Normal     Assessment/Plan: IUP at [redacted]w[redacted]d, FFN: positive 12/78/13 Threatened PTL Direction admit for Obs status on Antenatal Unit S/p 1 st dose of BTMZ at 4.00 hrs 08/16/12 IV ABX for PTL/GBS coverage ( Ampicillin) Tocolysis: Procardia 10 mg po Q 4hrly scheduled Continuous EFM with bathroom privileges Dr.Taavon will review in am      Plan:  Admit Antenatal for Obs PTL and IV ABX and BMTZ   Conservative management.  Jania Steinke, DENISECNM. 08/16/2012, 8:39 AM

## 2012-08-16 NOTE — Progress Notes (Signed)
Pt. To U/S via w/c.

## 2012-08-17 ENCOUNTER — Encounter (HOSPITAL_COMMUNITY): Payer: Self-pay

## 2012-08-17 DIAGNOSIS — O4700 False labor before 37 completed weeks of gestation, unspecified trimester: Secondary | ICD-10-CM | POA: Diagnosis present

## 2012-08-17 NOTE — Consult Note (Signed)
[facility]  Neonatal Medicine Consultation       08/17/2012    12:35 PM  I was called at the request of the patient's obstetrician (Dr. Billy Coast) to speak to this patient due to premature labor at 33 weeks.  I met with the patient and her husband, as well as the grandparents.  I reviewed expectations for a 33-34 week birth, including mortality and typical morbidity for such babies.  Mom plans to breast feed, so we talked about the issues involved in this kind of feeding of a premie.    _____________________ Electronically Signed By: Angelita Ingles, MD Neonatologist

## 2012-08-17 NOTE — Progress Notes (Signed)
Patient ID: Rebecca Ware, female   DOB: 04/29/1981, 31 y.o.   MRN: [id number] HD #[id number] 33 1/7 weeks PTL with pos FFN S: No complaints Denies contractions. No ROM, Pos FM No CP or SOB  O: BP 104/70  Pulse 66  Temp 97.5 F (36.4 C) (Oral)  Resp 18  Ht 5\' 2"  (1.575 m)  Wt 62.143 kg (137 lb)  BMI 25.06 kg/m2  LMP 12/29/2010  Lungs:CTA CV: RRR Abd: Gravid NT No CVAT EXT: no cords Pelvic: closed/80/0 Skin : intact Neuro: nonfocal  FHR- 130-150 , BTBV 5-25, no decels Irregular contractions with UI noted Category I tracing  Reactive NST x 3  OB sono c/w AGA and nl afI. EFW 5pounds, cephalic  A: 33 weeks PTL with positive FFN History of LGA  P: Procardia today and dc tomorrow BMZ series now completed at 0400 OB sono done pending results NICU consult Saline lock Probable DC tomorrow

## 2012-08-18 NOTE — Progress Notes (Signed)
Patient ID: Rebecca Ware, female   DOB: Apr 27, 1981, 31 y.o.   MRN: [id number] HD #3 33 1/7 weeks PTL with pos FFN  S: No complaints Denies contractions. No ROM, Pos FM No CP or SOB  O: BP 122/66  Pulse 62  Temp 98.2 F (36.8 C) (Oral)  Resp 18  Ht 5\' 2"  (1.575 m)  Wt 62.143 kg (137 lb)  BMI 25.06 kg/m2  LMP 12/29/2010  Lungs:CTA CV: RRR Abd: Gravid NT No CVAT EXT: no cords Pelvic: closed/80/0 Skin : intact Neuro: nonfocal  FHR- 120-130 , BTBV 5-25, no decels Irregular contractions with UI noted Category I tracing  Reactive NST x 3  OB sono c/w AGA and nl afI. EFW 5pounds, cephalic  A: 33 2/7 weeks PTL with positive FFN, stable History of LGA  P: DC Procardia  BMZ series now completed at 0400 OB sono done  NICU consult done Saline lock DC home

## 2012-08-18 NOTE — Progress Notes (Signed)
UR completed 

## 2012-08-19 ENCOUNTER — Other Ambulatory Visit: Payer: Self-pay | Admitting: Obstetrics and Gynecology

## 2012-08-19 ENCOUNTER — Encounter (HOSPITAL_COMMUNITY): Payer: Self-pay | Admitting: *Deleted

## 2012-08-19 ENCOUNTER — Inpatient Hospital Stay (HOSPITAL_COMMUNITY)
Admission: AD | Admit: 2012-08-19 | Discharge: 2012-08-25 | DRG: 775 | Disposition: A | Discharge status: Home / Self Care | Payer: 59 | Source: Ambulatory Visit | Attending: Obstetrics and Gynecology | Admitting: Obstetrics and Gynecology

## 2012-08-19 DIAGNOSIS — O4290 Premature rupture of membranes, unspecified as to length of time between rupture and onset of labor, unspecified weeks of gestation: Principal | ICD-10-CM | POA: Diagnosis present

## 2012-08-19 DIAGNOSIS — M79609 Pain in unspecified limb: Secondary | ICD-10-CM

## 2012-08-19 DIAGNOSIS — O4700 False labor before 37 completed weeks of gestation, unspecified trimester: Secondary | ICD-10-CM | POA: Diagnosis present

## 2012-08-19 LAB — CBC
HCT: 33.7 % — ABNORMAL LOW (ref 36.0–46.0)
MCH: 28.4 pg (ref 26.0–34.0)
MCV: 83.8 fL (ref 78.0–100.0)
Platelets: 253 10*3/uL (ref 150–400)
RBC: 4.02 MIL/uL (ref 3.87–5.11)

## 2012-08-19 LAB — CULTURE, BETA STREP (GROUP B ONLY)

## 2012-08-19 MED ORDER — ACETAMINOPHEN 325 MG PO TABS: 650.0000 mg | ORAL_TABLET | ORAL | Status: DC | PRN

## 2012-08-19 MED ORDER — CALCIUM CARBONATE ANTACID 500 MG PO CHEW: 2.0000 | CHEWABLE_TABLET | ORAL | Status: DC | PRN

## 2012-08-19 MED ORDER — ZOLPIDEM TARTRATE 5 MG PO TABS: 5.0000 mg | ORAL_TABLET | Freq: Every evening | ORAL | Status: DC | PRN

## 2012-08-19 MED ORDER — PRENATAL MULTIVITAMIN CH
1.0000 | ORAL_TABLET | Freq: Every day | ORAL | Status: DC
  Administered 2012-08-19 – 2012-08-21 (×3): 1 via ORAL
  Filled 2012-08-19 (×4): qty 1

## 2012-08-19 MED ORDER — DOCUSATE SODIUM 100 MG PO CAPS
100.0000 mg | ORAL_CAPSULE | Freq: Every day | ORAL | Status: DC
  Administered 2012-08-21 – 2012-08-22 (×2): 100 mg via ORAL
  Filled 2012-08-19 (×2): qty 1

## 2012-08-19 MED ORDER — DEXTROSE IN LACTATED RINGERS 5 % IV SOLN
INTRAVENOUS | Status: DC
  Administered 2012-08-19 (×2): via INTRAVENOUS
  Administered 2012-08-20: 125 mL/h via INTRAVENOUS

## 2012-08-19 NOTE — H&P (Signed)
Rebecca Ware, Rebecca Ware               ACCOUNT NO.:  [id number]  MEDICAL RECORD NO.:  [id number]  LOCATION:  9149                          FACILITY:  [facility]  PHYSICIAN:  Lenoard Aden, M.D.DATE OF BIRTH:  March 16, 1981  DATE OF ADMISSION:  08/19/2012 DATE OF DISCHARGE:                             HISTORY & PHYSICAL   CHIEF COMPLAINT:  Fluid leakage since __________.  HISTORY OF PRESENT ILLNESS:  She is a 31 year old white female, G2, P0 at 78 and 3/7th weeks' gestation who presents with leakage of fluid since last night.  She was seen in the office with confirmation of positive __________ consistent with preterm, premature ruptured membrane.  She reports good fetal movement.  Denies bleeding or leakage of fluid.  MEDICATIONS:  Folic acid, __________, vitamins, and DHA taken separately.  ALLERGIES:  She has no known drug allergies.  FAMILY HISTORY:  She has a family history of hypertension, varicose veins, depression.  She has a personal history of 2 previous pregnancy losses with D and C x2 and her current pregnancy has been complicated by preterm labor for which she was admitted 4 days ago, received betamethasone, group B strep was negative and was discharged to home after no further preterm cervical change was noted.  PHYSICAL EXAMINATION:  GENERAL:  She is a well-developed, well-nourished white female, in no acute distress. HEENT:  Normal. NECK:  Supple.  Full range of motion. LUNGS:  Clear. HEART:  Regular rhythm. ABDOMEN:  Soft, gravid, nontender. PELVIC:  Estimated fetal weight by ultrasound 5 pounds.  Cervix visually closed, previously 80% vertex, 0. EXTREMITIES:  There are no cords. NEUROLOGIC:  Nonfocal. SKIN: Intact.  NST is reactive, category 1 tracing.  Fetal heart tones 140-150 beats per minute range with good beat-to-beat variability and accelerations. No decelerations or contractions noted.  IMPRESSION: 1. Preterm, premature rupture of membranes at 33 and  3/7th weeks. 2. Betamethasone complete. 3. GBS negative.  PLAN:  Admit, expectant management, we will allow to labor spontaneously __________ otherwise will deliver at 34 weeks.  Continuous fetal monitoring at this time.     Lenoard Aden, M.D.     RJT/MEDQ  D:  08/19/2012  T:  08/19/2012  Job:  [id number]

## 2012-08-19 NOTE — Progress Notes (Signed)
Patient ID: Rebecca Ware, female   DOB: 1981/04/12, 31 y.o.   MRN: [id number] HD #[id number] 33 3/7 weeks PPROM  S: No complaints Denies contractions. Pos ROM, Pos FM No CP or SOB  O: BP 121/79  Pulse 64  Temp 99.1 F (37.3 C) (Oral)  Resp 18  Ht 5\' 2"  (1.575 m)  Wt 61.689 kg (136 lb)  BMI 24.87 kg/m2  LMP 12/29/2010  Lungs:CTA CV: RRR Abd: Gravid NT No CVAT EXT: no cords Pelvic: deferred , pos LOF Skin : intact Neuro: nonfocal CBC    Component Value Date/Time   WBC 13.2* 08/19/2012 1820   RBC 4.02 08/19/2012 1820   HGB 11.4* 08/19/2012 1820   HCT 33.7* 08/19/2012 1820   PLT 253 08/19/2012 1820   MCV 83.8 08/19/2012 1820   MCH 28.4 08/19/2012 1820   MCHC 33.8 08/19/2012 1820   RDW 12.6 08/19/2012 1820      FHR- 120-130 , BTBV 5-25, no decels Irregular contractions with UI noted Category I tracing  Reactive NST   A: 33 3/7 weeks PPROM- no s/s chorio History of LGA  P: Admit EFM and Toco BMZ complete Monitor s/s chorio Deliver at 34 weeks.

## 2012-08-20 DIAGNOSIS — O4700 False labor before 37 completed weeks of gestation, unspecified trimester: Secondary | ICD-10-CM | POA: Diagnosis present

## 2012-08-20 MED ORDER — SODIUM CHLORIDE 0.9 % IJ SOLN
3.0000 mL | Freq: Two times a day (BID) | INTRAMUSCULAR | Status: DC
  Administered 2012-08-21 (×2): 3 mL via INTRAVENOUS

## 2012-08-20 NOTE — Progress Notes (Signed)
Patient ID: Rebecca Ware, female   DOB: 01-15-1981, 31 y.o.   MRN: [id number] HD #[id number] 33 4/7 weeks PPROM  S: No complaints Denies contractions. Continued LOF, clear to pink Pos FM, No bleeding. No CP or SOB  O: BP 108/73  Pulse 72  Temp 98.1 F (36.7 C) (Oral)  Resp 20  Ht 5\' 2"  (1.575 m)  Wt 61.689 kg (136 lb)  BMI 24.87 kg/m2  LMP 12/29/2010  Lungs:CTA CV: RRR Abd: Gravid NT No CVAT EXT: no cords Pelvic: deferred , pos LOF Skin : intact Neuro: nonfocal  CBC    Component Value Date/Time   WBC 13.2* 08/19/2012 1820   RBC 4.02 08/19/2012 1820   HGB 11.4* 08/19/2012 1820   HCT 33.7* 08/19/2012 1820   PLT 253 08/19/2012 1820   MCV 83.8 08/19/2012 1820   MCH 28.4 08/19/2012 1820   MCHC 33.8 08/19/2012 1820   RDW 12.6 08/19/2012 1820      FHR- 120-130 , BTBV 5-25, no decels Irregular contractions with UI noted Category I tracing  Reactive NST   A: 33 4/7 weeks PPROM- no s/s chorio History of LGA  P: Expectant management EFM and Toco now one hour q shift BMZ complete Monitor s/s chorio Deliver at 34 weeks.

## 2012-08-21 NOTE — Progress Notes (Signed)
Patient ID: Triva Hueber, female   DOB: [birth year], 31 y.o.   MRN: [id number] HD #[id number] 33 5/7 weeks PPROM  S: No complaints today, back pain last night resolved Denies regular contractions but occasional cramping noted Continued LOF, clear to pink Pos FM, No bleeding. No CP or SOB  O: BP 115/73  Pulse 64  Temp 98.5 F (36.9 C) (Oral)  Resp 20  Ht 5\' 2"  (1.575 m)  Wt 61.689 kg (136 lb)  BMI 24.87 kg/m2  LMP 12/29/2010   Lungs:CTA CV: RRR Abd: Gravid NT No CVAT EXT: no cords Pelvic: deferred , pos LOF Skin : intact Neuro: nonfocal  CBC    Component Value Date/Time   WBC 13.2* 08/19/2012 1820   RBC 4.02 08/19/2012 1820   HGB 11.4* 08/19/2012 1820   HCT 33.7* 08/19/2012 1820   PLT 253 08/19/2012 1820   MCV 83.8 08/19/2012 1820   MCH 28.4 08/19/2012 1820   MCHC 33.8 08/19/2012 1820   RDW 12.6 08/19/2012 1820      FHR- 120-130 , BTBV 5-25, no decels Irregular contractions with UI noted Category I tracing  Reactive NST x 3  A: 33 5/7 weeks PPROM- no s/s chorio History of LGA  P: Expectant management EFM and Toco now one hour q shift BMZ complete Monitor s/s chorio Deliver at 34 weeks.

## 2012-08-22 MED ORDER — FLEET ENEMA 7-19 GM/118ML RE ENEM: 1.0000 | ENEMA | Freq: Every day | RECTAL | Status: DC | PRN

## 2012-08-22 NOTE — Progress Notes (Signed)
Patient ID: Rebecca Ware, female   DOB: August 15, 1981, 31 y.o.   MRN: [id number] HD #3 33 6/7 weeks PPROM  S: No complaints today, back pain last night resolved Denies regular contractions but occasional cramping noted Continued LOF, clear to pink Pos FM, No bleeding. No CP or SOB Reports constipation  O: BP 117/71  Pulse 77  Temp 98.4 F (36.9 C) (Oral)  Resp 20  Ht 5\' 2"  (1.575 m)  Wt 61.689 kg (136 lb)  BMI 24.87 kg/m2  LMP 12/29/2010    Lungs:CTA CV: RRR Abd: Gravid NT No CVAT EXT: no cords Pelvic: deferred , pos LOF Skin : intact Neuro: nonfocal  CBC    Component Value Date/Time   WBC 13.2* 08/19/2012 1820   RBC 4.02 08/19/2012 1820   HGB 11.4* 08/19/2012 1820   HCT 33.7* 08/19/2012 1820   PLT 253 08/19/2012 1820   MCV 83.8 08/19/2012 1820   MCH 28.4 08/19/2012 1820   MCHC 33.8 08/19/2012 1820   RDW 12.6 08/19/2012 1820      FHR- 120-130 , BTBV 5-25, no decels Irregular contractions with UI noted Category I tracing  Reactive NST x 3  A: 33 6/7 weeks PPROM- no s/s chorio History of LGA  P: Expectant management EFM and Toco now one hour q shift BMZ complete Monitor s/s chorio Deliver at 34 weeks -tomorrow Rpt cbc tomorrow

## 2012-08-23 ENCOUNTER — Encounter (HOSPITAL_COMMUNITY): Payer: Self-pay | Admitting: Anesthesiology

## 2012-08-23 ENCOUNTER — Inpatient Hospital Stay (HOSPITAL_COMMUNITY): Payer: 59 | Admitting: Anesthesiology

## 2012-08-23 ENCOUNTER — Encounter (HOSPITAL_COMMUNITY): Payer: Self-pay | Admitting: *Deleted

## 2012-08-23 LAB — TYPE AND SCREEN: Antibody Screen: POSITIVE

## 2012-08-23 LAB — CBC
Hemoglobin: 12.3 g/dL (ref 12.0–15.0)
MCH: 28.3 pg (ref 26.0–34.0)
MCHC: 34.1 g/dL (ref 30.0–36.0)
MCV: 83.2 fL (ref 78.0–100.0)
RBC: 4.34 MIL/uL (ref 3.87–5.11)

## 2012-08-23 LAB — RPR: RPR Ser Ql: NONREACTIVE

## 2012-08-23 MED ORDER — ONDANSETRON HCL 4 MG/2ML IJ SOLN: 4.0000 mg | INTRAMUSCULAR | Status: DC | PRN

## 2012-08-23 MED ORDER — DIBUCAINE 1 % RE OINT: 1.0000 "application " | TOPICAL_OINTMENT | RECTAL | Status: DC | PRN

## 2012-08-23 MED ORDER — LACTATED RINGERS IV SOLN: 500.0000 mL | Freq: Once | INTRAVENOUS | Status: DC

## 2012-08-23 MED ORDER — PRENATAL MULTIVITAMIN CH
1.0000 | ORAL_TABLET | Freq: Every day | ORAL | Status: DC
  Administered 2012-08-23 – 2012-08-25 (×3): 1 via ORAL
  Filled 2012-08-23 (×3): qty 1

## 2012-08-23 MED ORDER — IBUPROFEN 600 MG PO TABS
600.0000 mg | ORAL_TABLET | Freq: Four times a day (QID) | ORAL | Status: DC
  Administered 2012-08-23 – 2012-08-25 (×7): 600 mg via ORAL
  Filled 2012-08-23 (×8): qty 1

## 2012-08-23 MED ORDER — OXYTOCIN BOLUS FROM INFUSION: 500.0000 mL | INTRAVENOUS | Status: DC

## 2012-08-23 MED ORDER — ONDANSETRON HCL 4 MG PO TABS: 4.0000 mg | ORAL_TABLET | ORAL | Status: DC | PRN

## 2012-08-23 MED ORDER — FLEET ENEMA 7-19 GM/118ML RE ENEM: 1.0000 | ENEMA | RECTAL | Status: DC | PRN

## 2012-08-23 MED ORDER — SENNOSIDES-DOCUSATE SODIUM 8.6-50 MG PO TABS
2.0000 | ORAL_TABLET | Freq: Every day | ORAL | Status: DC
  Administered 2012-08-23 – 2012-08-24 (×2): 2 via ORAL

## 2012-08-23 MED ORDER — EPHEDRINE 5 MG/ML INJ: 10.0000 mg | INTRAVENOUS | Status: DC | PRN

## 2012-08-23 MED ORDER — TERBUTALINE SULFATE 1 MG/ML IJ SOLN: 0.2500 mg | Freq: Once | INTRAMUSCULAR | Status: DC | PRN

## 2012-08-23 MED ORDER — DIPHENHYDRAMINE HCL 50 MG/ML IJ SOLN: 12.5000 mg | INTRAMUSCULAR | Status: DC | PRN

## 2012-08-23 MED ORDER — SIMETHICONE 80 MG PO CHEW: 80.0000 mg | CHEWABLE_TABLET | ORAL | Status: DC | PRN

## 2012-08-23 MED ORDER — OXYTOCIN 40 UNITS IN LACTATED RINGERS INFUSION - SIMPLE MED
1.0000 m[IU]/min | INTRAVENOUS | Status: DC
  Administered 2012-08-23: 2 m[IU]/min via INTRAVENOUS
  Filled 2012-08-23: qty 1000

## 2012-08-23 MED ORDER — METHYLERGONOVINE MALEATE 0.2 MG/ML IJ SOLN: 0.2000 mg | INTRAMUSCULAR | Status: DC | PRN

## 2012-08-23 MED ORDER — EPHEDRINE 5 MG/ML INJ
10.0000 mg | INTRAVENOUS | Status: DC | PRN
  Filled 2012-08-23: qty 4

## 2012-08-23 MED ORDER — ACETAMINOPHEN 325 MG PO TABS: 650.0000 mg | ORAL_TABLET | ORAL | Status: DC | PRN

## 2012-08-23 MED ORDER — FENTANYL 2.5 MCG/ML BUPIVACAINE 1/10 % EPIDURAL INFUSION (WH - ANES)
14.0000 mL/h | INTRAMUSCULAR | Status: DC
  Administered 2012-08-23: 14 mL/h via EPIDURAL
  Filled 2012-08-23 (×2): qty 125

## 2012-08-23 MED ORDER — METHYLERGONOVINE MALEATE 0.2 MG PO TABS: 0.2000 mg | ORAL_TABLET | ORAL | Status: DC | PRN

## 2012-08-23 MED ORDER — PHENYLEPHRINE 40 MCG/ML (10ML) SYRINGE FOR IV PUSH (FOR BLOOD PRESSURE SUPPORT): 80.0000 ug | PREFILLED_SYRINGE | INTRAVENOUS | Status: DC | PRN

## 2012-08-23 MED ORDER — TETANUS-DIPHTH-ACELL PERTUSSIS 5-2.5-18.5 LF-MCG/0.5 IM SUSP: 0.5000 mL | Freq: Once | INTRAMUSCULAR | Status: DC

## 2012-08-23 MED ORDER — OXYCODONE-ACETAMINOPHEN 5-325 MG PO TABS: 1.0000 | ORAL_TABLET | ORAL | Status: DC | PRN

## 2012-08-23 MED ORDER — IBUPROFEN 600 MG PO TABS: 600.0000 mg | ORAL_TABLET | Freq: Four times a day (QID) | ORAL | Status: DC | PRN

## 2012-08-23 MED ORDER — LANOLIN HYDROUS EX OINT: TOPICAL_OINTMENT | CUTANEOUS | Status: DC | PRN

## 2012-08-23 MED ORDER — BENZOCAINE-MENTHOL 20-0.5 % EX AERO
1.0000 "application " | INHALATION_SPRAY | CUTANEOUS | Status: DC | PRN
  Administered 2012-08-25: 1 via TOPICAL
  Filled 2012-08-23 (×2): qty 56

## 2012-08-23 MED ORDER — LACTATED RINGERS IV SOLN: 500.0000 mL | INTRAVENOUS | Status: DC | PRN

## 2012-08-23 MED ORDER — OXYCODONE-ACETAMINOPHEN 5-325 MG PO TABS
1.0000 | ORAL_TABLET | ORAL | Status: DC | PRN
Start: 2012-08-23 — End: 2012-08-25

## 2012-08-23 MED ORDER — ZOLPIDEM TARTRATE 5 MG PO TABS: 5.0000 mg | ORAL_TABLET | Freq: Every evening | ORAL | Status: DC | PRN

## 2012-08-23 MED ORDER — LIDOCAINE HCL (PF) 1 % IJ SOLN: 30.0000 mL | INTRAMUSCULAR | Status: DC | PRN

## 2012-08-23 MED ORDER — DIPHENHYDRAMINE HCL 25 MG PO CAPS: 25.0000 mg | ORAL_CAPSULE | Freq: Four times a day (QID) | ORAL | Status: DC | PRN

## 2012-08-23 MED ORDER — PHENYLEPHRINE 40 MCG/ML (10ML) SYRINGE FOR IV PUSH (FOR BLOOD PRESSURE SUPPORT)
80.0000 ug | PREFILLED_SYRINGE | INTRAVENOUS | Status: DC | PRN
  Filled 2012-08-23: qty 5

## 2012-08-23 MED ORDER — CITRIC ACID-SODIUM CITRATE 334-500 MG/5ML PO SOLN: 30.0000 mL | ORAL | Status: DC | PRN

## 2012-08-23 MED ORDER — LACTATED RINGERS IV SOLN
INTRAVENOUS | Status: DC
  Administered 2012-08-23: 12:00:00 via INTRAVENOUS

## 2012-08-23 MED ORDER — LIDOCAINE HCL (PF) 1 % IJ SOLN
INTRAMUSCULAR | Status: DC | PRN
  Administered 2012-08-23 (×2): 9 mL

## 2012-08-23 MED ORDER — ONDANSETRON HCL 4 MG/2ML IJ SOLN: 4.0000 mg | Freq: Four times a day (QID) | INTRAMUSCULAR | Status: DC | PRN

## 2012-08-23 MED ORDER — OXYTOCIN 40 UNITS IN LACTATED RINGERS INFUSION - SIMPLE MED: 62.5000 mL/h | INTRAVENOUS | Status: DC

## 2012-08-23 MED ORDER — FENTANYL 2.5 MCG/ML BUPIVACAINE 1/10 % EPIDURAL INFUSION (WH - ANES)
INTRAMUSCULAR | Status: DC | PRN
  Administered 2012-08-23: 14 mL/h via EPIDURAL

## 2012-08-23 MED ORDER — WITCH HAZEL-GLYCERIN EX PADS: 1.0000 "application " | MEDICATED_PAD | CUTANEOUS | Status: DC | PRN

## 2012-08-23 NOTE — Progress Notes (Signed)
Rebecca Ware is a 31 y.o. G3P0020 at [redacted]w[redacted]d by LMP admitted for induction of labor due to PPROM.  Subjective: Occ pressure  Objective: BP 120/73  Pulse 75  Temp 97.7 F (36.5 C) (Oral)  Resp 18  Ht 5\' 2"  (1.575 m)  Wt 61.689 kg (136 lb)  BMI 24.87 kg/m2  SpO2 98%  LMP 12/29/2010      FHT:  FHR: 145 bpm, variability: moderate,  accelerations:  Present,  decelerations:  Absent UC:   regular, every 2 minutes SVE:   Dilation: 9 Effacement (%): 100 Station: +1 Exam by:: Jazzalynn Rhudy  Labs: Lab Results  Component Value Date   WBC 15.2* 08/23/2012   HGB 12.3 08/23/2012   HCT 36.1 08/23/2012   MCV 83.2 08/23/2012   PLT 276 08/23/2012    Assessment / Plan: Induction of labor due to PROM,  progressing well on pitocin  Labor: Progressing normally Preeclampsia:  na Fetal Wellbeing:  Category I Pain Control:  Epidural I/D:  n/a Anticipated MOD:  NSVD  Katilynn Sinkler J 08/23/2012, 4:26 PM

## 2012-08-23 NOTE — Progress Notes (Signed)
Assisted to bathroom with steady.  Alternate cold and warm peri care done to stimulate voiding done for 15 min without    Success.  Back to bed, uterus deviated to right and bladder distended upon palpation.  Bladder scan done showing   691 cc urine residual.  In and out cath.done 900cc urine out.

## 2012-08-23 NOTE — Progress Notes (Signed)
Rebecca Ware is a 31 y.o. G3P0020 at [redacted]w[redacted]d by LMP admitted for induction of labor due to PPROM.  Subjective: Feels occ pressure  Objective: BP 121/77  Pulse 82  Temp 97.8 F (36.6 C) (Oral)  Resp 20  Ht 5\' 2"  (1.575 m)  Wt 61.689 kg (136 lb)  BMI 24.87 kg/m2  SpO2 98%  LMP 12/29/2010      FHT:  FHR: 145 bpm, variability: moderate,  accelerations:  Present,  decelerations:  Absent UC:   regular, every 2 minutes SVE:   4/90/+1  Labs: Lab Results  Component Value Date   WBC 15.2* 08/23/2012   HGB 12.3 08/23/2012   HCT 36.1 08/23/2012   MCV 83.2 08/23/2012   PLT 276 08/23/2012    Assessment / Plan: Induction of labor due to PROM,  progressing well on pitocin  Labor: Progressing normally Preeclampsia:  na Fetal Wellbeing:  Category I Pain Control:  Epidural I/D:  n/a Anticipated MOD:  NSVD  Lashandra Arauz J 08/23/2012, 12:56 PM

## 2012-08-23 NOTE — Progress Notes (Signed)
Rebecca Ware is a 31 y.o. G3P0020 at [redacted]w[redacted]d by LMP admitted for induction of labor due to PPROM.  Subjective: Comfortable  Objective: BP 115/78  Pulse 88  Temp 98 F (36.7 C) (Oral)  Resp 20  Ht 5\' 2"  (1.575 m)  Wt 61.689 kg (136 lb)  BMI 24.87 kg/m2  LMP 12/29/2010      FHT:  FHR: 155 bpm, variability: moderate,  accelerations:  Present,  decelerations:  Absent UC:   irregular, every 2-5 minutes SVE:   Dilation: 1.5 Effacement (%): 90 Station: -1 Exam by:: k fields, rn Clear AF noted  Labs: Lab Results  Component Value Date   WBC 15.2* 08/23/2012   HGB 12.3 08/23/2012   HCT 36.1 08/23/2012   MCV 83.2 08/23/2012   PLT 276 08/23/2012    Assessment / Plan: Induction of labor due to PROM,  progressing well on pitocin  Labor: Progressing normally Preeclampsia:  na Fetal Wellbeing:  Category I Pain Control:  Labor support without medications I/D:  n/a Anticipated MOD:  NSVD NICU at delivery  Soriyah Osberg J 08/23/2012, 8:10 AM

## 2012-08-23 NOTE — Anesthesia Preprocedure Evaluation (Signed)
Anesthesia Evaluation  Patient identified by MRN, date of birth, ID band Patient awake    Reviewed: Allergy & Precautions, H&P , NPO status , Patient's Chart, lab work & pertinent test results  Airway Mallampati: I TM Distance: >3 FB Neck ROM: full    Dental No notable dental hx.    Pulmonary neg pulmonary ROS,  breath sounds clear to auscultation  Pulmonary exam normal       Cardiovascular negative cardio ROS      Neuro/Psych negative neurological ROS     GI/Hepatic negative GI ROS, Neg liver ROS,   Endo/Other  negative endocrine ROS  Renal/GU negative Renal ROS  negative genitourinary   Musculoskeletal negative musculoskeletal ROS (+)   Abdominal Normal abdominal exam  (+)   Peds negative pediatric ROS (+)  Hematology negative hematology ROS (+)   Anesthesia Other Findings   Reproductive/Obstetrics (+) Pregnancy                           Anesthesia Physical Anesthesia Plan  ASA: II  Anesthesia Plan: Epidural   Post-op Pain Management:    Induction:   Airway Management Planned:   Additional Equipment:   Intra-op Plan:   Post-operative Plan:   Informed Consent: I have reviewed the patients History and Physical, chart, labs and discussed the procedure including the risks, benefits and alternatives for the proposed anesthesia with the patient or authorized representative who has indicated his/her understanding and acceptance.     Plan Discussed with:   Anesthesia Plan Comments:         Anesthesia Quick Evaluation

## 2012-08-23 NOTE — Anesthesia Procedure Notes (Signed)
Epidural Patient location during procedure: OB Start time: 08/23/2012 12:04 PM End time: 08/23/2012 12:08 PM  Staffing Anesthesiologist: Sandrea Hughs Performed by: anesthesiologist   Preanesthetic Checklist Completed: patient identified, site marked, surgical consent, pre-op evaluation, timeout performed, IV checked, risks and benefits discussed and monitors and equipment checked  Epidural Patient position: sitting Prep: site prepped and draped and DuraPrep Patient monitoring: continuous pulse ox and blood pressure Approach: midline Injection technique: LOR air  Needle:  Needle type: Tuohy  Needle gauge: 17 G Needle length: 9 cm and 9 Needle insertion depth: 4 cm Catheter type: closed end flexible Catheter size: 19 Gauge Catheter at skin depth: 9 cm Test dose: negative  Assessment Sensory level: T8 Events: blood not aspirated, injection not painful, no injection resistance, negative IV test and no paresthesia  Additional Notes Reason for block:procedure for pain

## 2012-08-24 LAB — CBC
HCT: 30 % — ABNORMAL LOW (ref 36.0–46.0)
Hemoglobin: 10.1 g/dL — ABNORMAL LOW (ref 12.0–15.0)
MCH: 27.8 pg (ref 26.0–34.0)
MCHC: 33.7 g/dL (ref 30.0–36.0)
RDW: 12.8 % (ref 11.5–15.5)

## 2012-08-24 MED ORDER — RHO D IMMUNE GLOBULIN 1500 UNIT/2ML IJ SOLN
300.0000 ug | Freq: Once | INTRAMUSCULAR | Status: AC
  Administered 2012-08-24: 300 ug via INTRAMUSCULAR
  Filled 2012-08-24: qty 2

## 2012-08-24 MED ORDER — POLYSACCHARIDE IRON COMPLEX 150 MG PO CAPS
150.0000 mg | ORAL_CAPSULE | Freq: Every day | ORAL | Status: DC
  Administered 2012-08-24: 150 mg via ORAL
  Filled 2012-08-24 (×3): qty 1

## 2012-08-24 NOTE — Progress Notes (Signed)
Still unable to void after several attempts.  Patient started to complain of pressured in lower abdomen.  Bladder distended on palpation and uterus to right.  2nd in and out catheterization done with 850 cc out.

## 2012-08-24 NOTE — Anesthesia Postprocedure Evaluation (Signed)
  Anesthesia Post-op Note  Patient: Rebecca Ware  Procedure(s) Performed: * No procedures listed *  Patient Location: [facility]  Anesthesia Type:Epidural  Level of Consciousness: awake, alert  and oriented  Airway and Oxygen Therapy: Patient Spontanous Breathing  Post-op Pain: none  Post-op Assessment: Post-op Vital signs reviewed and Patient's Cardiovascular Status Stable  Post-op Vital Signs: Reviewed and stable  Complications: No apparent anesthesia complications

## 2012-08-24 NOTE — Progress Notes (Signed)
I visited with pt and family while making rounds on the unit.  They seemed somewhat anxious, but reported that they were doing much better.  They are grateful that their son is doing well and are hopeful that he may be home soon.  They did not wish to talk further at this time, but they are aware of on-going availability of spiritual care services.  We will continue to follow up with them in the NICU when we see them.  [employer] Pager, [phone] 10:18 AM

## 2012-08-24 NOTE — Progress Notes (Addendum)
Post Partum Day 1 NSVD at [redacted]w[redacted]d viable female infant.   Subjective: no complaints, up ad lib without syncope, voiding, tolerating PO, + flatus, +BM  Pain well controlled with po meds, taking motrin and percocet  BF: Pumping and tried latching today in NICU. Mood stable, bonding well   Objective: Blood pressure 103/65, pulse 71, temperature 98.2 F (36.8 C), temperature source Oral, resp. rate 18, height 5\' 2"  (1.575 m), weight 136 lb (61.689 kg), last menstrual period 12/29/2010, SpO2 100.00%.  Physical Exam:  General: alert, cooperative and no distress Breasts: N/T Lungs: CTAB Heart: RRR Lochia: appropriate Uterine Fundus: firm Perineum:healing but slightly swollen. DVT Evaluation: No evidence of DVT seen on physical exam. Negative Homan's sign. No cords or calf tenderness. No significant calf/ankle edema.   Basename 08/24/12 0500 08/23/12 0507  HGB 10.1* 12.3  HCT 30.0* 36.1   Anemia of pregnancy. Started on Niferex 150mg  po daily. Mother visiting in NICU for long periods. Baby transitioning well and starting to latch.   Assessment/Plan: Plan for discharge tomorrow Plans circumcision  Prior to discharge.      LOS: 5 days   Jarrah Babich 08/24/2012, 12:12 PM

## 2012-08-24 NOTE — Progress Notes (Signed)
Ur chart review completed.  

## 2012-08-25 ENCOUNTER — Encounter (HOSPITAL_COMMUNITY): Payer: Self-pay | Admitting: *Deleted

## 2012-08-25 ENCOUNTER — Encounter (HOSPITAL_COMMUNITY)
Admission: RE | Admit: 2012-08-25 | Discharge: 2012-08-25 | Disposition: A | Discharge status: Home / Self Care | Payer: 59 | Source: Ambulatory Visit | Attending: Obstetrics and Gynecology | Admitting: Obstetrics and Gynecology

## 2012-08-25 DIAGNOSIS — O923 Agalactia: Secondary | ICD-10-CM | POA: Insufficient documentation

## 2012-08-25 LAB — RH IG WORKUP (INCLUDES ABO/RH): Unit division: 0

## 2012-08-25 MED ORDER — OXYCODONE-ACETAMINOPHEN 5-325 MG PO TABS: 1.0000 | ORAL_TABLET | Freq: Four times a day (QID) | ORAL | Status: DC | PRN

## 2012-08-25 MED ORDER — IBUPROFEN 600 MG PO TABS: 600.0000 mg | ORAL_TABLET | Freq: Four times a day (QID) | ORAL | Status: DC

## 2012-08-25 NOTE — Discharge Summary (Signed)
Physician Discharge Summary  Patient ID: Rebecca Ware MRN: [id number] DOB/AGE: 10-31-1980 31 y.o.  Admit date: 08/19/2012 Discharge date: 08/25/2012  Admission Diagnoses: PPROM, Augmented with Pitocin as ROM at [redacted]w[redacted]d   Discharge Diagnoses:  Active Problems:  Threatened preterm labor, antepartum  NSVD (normal spontaneous vaginal delivery)   Discharged Condition: stable  Hospital Course: Preterm delivery at [redacted]w[redacted]d   Consults: None  Significant Diagnostic Studies: labs:routine and positive FFN at [redacted]w[redacted]d , microbiology: GBS: neg and radiology: Ultrasound: antaomy negative  Treatments: IV hydration, antibiotics: Penicillin PTL  and analgesia: acetaminophen w/ codeine and Ibuprofen  Discharge Exam: Blood pressure 110/77, pulse 63, temperature 97.8 F (36.6 C), temperature source Axillary, resp. rate 18, height 5\' 2"  (1.575 m), weight 136 lb (61.689 kg), last menstrual period 12/29/2010, SpO2 100.00%, unknown if currently breastfeeding.  ROS: General appearance: alert, cooperative and no distress AffectL AAO x3 Lungs: CTAB Breasts: N/T Lungs: CTAB Abdomen: soft, N/T, B/S x 4 Fundus: -2/u, firm Lochia: Min, Rubra Perineum: healing well GI: normal GU: voiding with no problems Extremities: no swelling / no edema bilaterally  Disposition: 01-Home or Self Care  Discharge Orders    Future Orders Please Complete By Expires   Diet general      Discharge instructions      Comments:   Per [facility]       Medication List     As of 08/25/2012 11:28 AM    STOP taking these medications         aspirin 81 MG tablet      folic acid 1 MG tablet   Commonly known as: FOLVITE      TAKE these medications         flintstones complete 60 MG chewable tablet   Chew 2 tablets by mouth daily.      ibuprofen 600 MG tablet   Commonly known as: ADVIL,MOTRIN   Take 1 tablet (600 mg total) by mouth every 6 (six) hours.      OVER THE COUNTER MEDICATION   Take 1 tablet by mouth  daily. Pt takes DHA gummies      oxyCODONE-acetaminophen 5-325 MG per tablet   Commonly known as: PERCOCET/ROXICET   Take 1-2 tablets by mouth every 6 (six) hours as needed (moderate - severe pain).           Follow-up Information    Follow up with [facility] In 6 weeks. (As needed)    Contact information:   [address] [place] [address] [phone]        Will be taking Floridix Liquid Iron po BID for anemia of pregnancy.  Signed: Earl Gala, CNM. 08/25/2012, 11:28 AM

## 2012-08-25 NOTE — Progress Notes (Signed)
08/25/12 1500  Clinical Encounter Type  Visited With Patient and family together (Husband)  Visit Type Spiritual support;Social support  Spiritual Encounters  Spiritual Needs Emotional  Stress Factors  Patient Stress Factors Exhausted  Family Stress Factors Exhausted    Followed up with Victorino Dike and her husband after my colleague Chaplain Dyanne Carrel made initial pastoral contact.  Couple is feeling very tired, but very pleased with all aspects of family's care at [facility] and thus well prepared for discharge and next steps.    Provided opportunity for family to share and process emotions as they approach discharge and begin a new balance of visiting baby from home.  Spiritual Care will continue to follow in NICU for support.  [address] [place], [place] [phone]

## 2012-08-25 NOTE — Progress Notes (Signed)
Pt d/c to home   Ambulated out  Teaching complete  

## 2012-08-25 NOTE — Progress Notes (Signed)
Post Partum Day 2 NSVD  Complication of preterm delivery at [redacted]w[redacted]d Viable female infant. Subjective: no complaints, up ad lib without syncope, voiding, tolerating PO, + flatus, +BM  Pain well controlled with po meds, taking motrin and percocet  BF: Latching and pumping as baby remain in NICU. Mood stable, bonding well.  Objective: Blood pressure 110/77, pulse 63, temperature 97.8 F (36.6 C), temperature source Axillary, resp. rate 18, height 5\' 2"  (1.575 m), weight 136 lb (61.689 kg), last menstrual period 12/29/2010, SpO2 100.00%.  Physical Exam:  General: alert, cooperative and no distress Breasts:N/T Lungs: CTAB Heart: RRR Lochia: appropriate, Min Rubra Uterine Fundus: firm Perineum: healing well and advised re pericare DVT Evaluation: No evidence of DVT seen on physical exam. Negative Homan's sign. No cords or calf tenderness. No significant calf/ankle edema.   Basename 08/24/12 0500 08/23/12 0507  HGB 10.1* 12.3  HCT 30.0* 36.1   Anemia of pregnancy. Taking po iron supplement daily - Niferex 150mg  po daily. Has started on Floridix Liquid Iron and will not continue Niferex.   Assessment/Plan: Discharge home. Plans circumcision prior to discharge from hospital. Baby remains in NICU.      LOS: 6 days   Jael Kostick, CNM. 08/25/2012, 6:30 AM

## 2012-09-25 ENCOUNTER — Encounter (HOSPITAL_COMMUNITY)
Admission: RE | Admit: 2012-09-25 | Discharge: 2012-09-25 | Disposition: A | Discharge status: Home / Self Care | Payer: 59 | Source: Ambulatory Visit | Attending: Obstetrics and Gynecology | Admitting: Obstetrics and Gynecology

## 2012-09-25 DIAGNOSIS — O923 Agalactia: Secondary | ICD-10-CM | POA: Insufficient documentation

## 2012-09-27 NOTE — Discharge Summary (Signed)
Rebecca Ware, Rebecca Ware               ACCOUNT NO.:  [id number]  MEDICAL RECORD NO.:  [id number]  LOCATION:  9305                          FACILITY:  [facility]  PHYSICIAN:  Lenoard Aden, M.D.DATE OF BIRTH:  December 14, 1980  DATE OF ADMISSION:  08/19/2012 DATE OF DISCHARGE:  08/26/2012                              DISCHARGE SUMMARY   The patient was admitted with preterm, premature rupture of membranes. Fetal heart rate tracing was reactive on continuous surveillance.  No evidence of infection.  Given the time related to her rupture of membranes, decision was made to deliver at 34 weeks, at which time, she was induced without difficulty and had a spontaneous vaginal delivery without complications.  She was discharged to home.  DISCHARGE MEDICATIONS:  Prenatal vitamins and iron.  ADMISSION DIAGNOSIS:  Preterm, premature rupture of membranes.  DISCHARGE DIAGNOSIS:  Preterm, premature rupture of membranes, status post vaginal delivery.     Lenoard Aden, M.D.     RJT/MEDQ  D:  09/26/2012  T:  09/27/2012  Job:  [id number]

## 2012-11-25 ENCOUNTER — Ambulatory Visit (INDEPENDENT_AMBULATORY_CARE_PROVIDER_SITE_OTHER): Payer: 59 | Admitting: Sports Medicine

## 2012-11-25 VITALS — BP 102/69 | Ht 62.0 in | Wt 110.0 lb

## 2012-11-25 DIAGNOSIS — R109 Unspecified abdominal pain: Secondary | ICD-10-CM | POA: Insufficient documentation

## 2012-11-25 NOTE — Assessment & Plan Note (Signed)
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 Discussed multiple different options and addressed all questions patient today. The literature search there does not appear to be any significant good results with conservative therapy. Patient of course would like to avoid surgery if possible but also would like to get back to her regular daily regimen which includes running had a fairly highly competitive level. With this pain at this time it is nothing like that would be possible. Discussed case over the phone with Dr. gross. He would be more than willing to see patient in message has been left for her to see she would like to make that appointment. He agrees that likely surgical intervention would be the most helpful to this patient. In the interim patient OT and start to cross train doing more biking patient was given more isometric abdominal exercises. Once patient decides that she wants to try to do more of a conservative approach per se surgical approach she can follow up with [place] in 4 weeks. Patient warned the potential signs of incarcerated bowel and to seek medical attention immediately if this does occur.

## 2012-11-25 NOTE — Progress Notes (Signed)
Chief complaint: Abdominal wall pain  History of present illness: The patient is a very pleasant 32 year old avid runner who is coming in with abdominal wall pain. Patient did have her first child 12 weeks ago, vaginal birth after 20 minutes of pushing. The patient as well as baby was doing fine. Patient has started to try to increase her exercise activities include back to running which he used to do on a daily basis. Unfortunately whenever she runs and certain other activities she does have some abdominal wall pain. Patient states that this is superior to her bellybutton and more on the right side. Patient does not feel a bulge.  Patient does state recently the only bowel changes she's had it she's had times where she was running and she feels that she has to go to the restroom immediately. Patient denies any blood in her stool. Patient denies any abnormal weight loss but has been trying to lose weight since after her pregnancy. Patient is to try taking any medications for this. Patient describes the pain more as a dull aching sensation that can have sharp pain with certain movements especially with running.  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 flintstones complete (FLINTSTONES) 60 MG chewable tablet Chew 2 tablets by mouth daily.        Marland Kitchen ibuprofen (ADVIL,MOTRIN) 600 MG tablet Take 1 tablet (600 mg total) by mouth every 6 (six) hours.  30 tablet  1  . OVER THE COUNTER MEDICATION Take 1 tablet by mouth daily. Pt takes DHA gummies       . oxyCODONE-acetaminophen (PERCOCET/ROXICET) 5-325 MG per tablet Take 1-2 tablets by mouth every 6 (six) hours as needed (moderate - severe pain).  10 tablet  0   No current facility-administered medications on file prior to visit.    Physical exam Blood pressure 102/69, height 5\' 2"  (1.575 m), weight 110 lb (49.896 kg). General: No apparent distress alert and oriented x3 mood and affect normal Respiratory: Patient's speak in full sentences and does not appear short of breath Skin: Warm dry intact with no signs of infection or rash Neuro: Cranial nerves II through XII are intact, neurovascularly intact in all extremities with 2+ DTRs and 2+ pulses. Abdominal exam: On inspection no gross deformity. Palpation patient is somewhat tender to palpation proximally 4 cm superior to the umbilicus and 3 cm over from midline to the right side near where the rectus abdominis in the external oblique sleep. There is a small palpable defect but with Valsalva maneuver there is no bulging appreciated. Patient is tender in this area to direct palpation. There is no erythema or redness. Rest of abdominal exam is unremarkable.  Musculoskeletal ultrasound was performed and interpreted by me today. All cultures have been healed was taken that did show the patient has a defect in the fascia of the deep musculature there does have bowel that can come through with the Valsalva. There is no evidence of superficial fascia or the rectus abdominis. No significant hypoechoic changes in that vicinity. 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

## 2012-11-25 NOTE — Patient Instructions (Signed)
Congrats on [place]! Your have a Spigelian Hernia.   We want you to try to avoid running for now. Try to see if it hurts with cycling and potentially with elliptical.  Try to do the isometric exercises for the abdominals that Dr. Darrick Penna showed you.  One to bring up your left leg and pull across your body, planks as well.  Also, we are going to give you a abdominal binder that you should wear when you do workout.  We will try to get more information with handouts of exercises if we can.  Come back in 4 weeks and we will see how you are doing.  If at any time you have severe pain, bowel trouble that seems to be worsening or nausea and vomiting you should be seen immediately.

## 2012-11-26 ENCOUNTER — Ambulatory Visit (INDEPENDENT_AMBULATORY_CARE_PROVIDER_SITE_OTHER): Payer: 59 | Admitting: Surgery

## 2012-11-26 VITALS — BP 98/62 | HR 68 | Temp 97.8°F | Resp 18 | Ht 62.0 in | Wt 110.0 lb

## 2012-11-26 NOTE — Progress Notes (Addendum)
Subjective:     Patient ID: Rebecca Ware, female   DOB: February 27, 1981, 32 y.o.   MRN: [id number]  Abdominal Pain Associated symptoms include nausea. Pertinent negatives include no constipation, diarrhea, dysuria, fever, frequency, myalgias or vomiting.    Rebecca Ware  30-Oct-1980 [id number]  Patient Care Team: Enid Baas, MD as PCP - General (Family Medicine) Judi Saa, DO as Attending Physician (Family Medicine)  This patient is a 32 y.o.female who presents today for surgical evaluation at the request of Dr. Smith/Fields.   Reason for visit: Right-sided abdominal pain.  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  Marathon runner.  Delivered her baby in the fall.  We did six weeks.  Began running again.  Noticed intermittent sharp abdominal pains.  Always the same location.  Had one episode of nausea.  Another episode of fecal urgency.  Pain is worsened with activity.  She was concerned.  She went and saw her medicine office.  Ultrasound was done suspicious for his daily and hernia on the right paramedian region.  They called yesterday.  We fit in the patient in the next day.  No personal nor family history of GI/colon cancer, inflammatory bowel disease, irritable bowel syndrome, allergy such as Celiac Sprue, dietary/dairy problems, colitis, ulcers nor gastritis.  No recent sick contacts/gastroenteritis.  No travel outside the country.  No changes in diet.    Patient Active Problem List  Diagnosis  . ENTHESOPATHY OF HIP REGION  . Pain in soft tissues of limb  . PES PLANUS  . UNEQUAL LEG LENGTH  . ACHILLES TENDON TEAR  . Abdominal  pain, other specified site    Past Medical History  Diagnosis Date  . Anxiety 2010    took medication x 6 months  . Abnormal Pap smear 2011    colpo. repeat WNL  . MTHFR mutation   . Threatened preterm labor 33 wks 08/17/2012    Past Surgical History  Procedure Laterality Date  . Eye surgery  2002  . Dilation  and evacuation  03/29/2011    Procedure: DILATATION AND EVACUATION (D&E);  Surgeon: Genia Del;  Location: [facility];  Service: Gynecology;  Laterality: N/A;  . Dilation and evacuation  07/08/2011    Procedure: DILATATION AND EVACUATION (D&E);  Surgeon: Lenoard Aden, MD;  Location: [facility];  Service: Gynecology;  Laterality: N/A;  . Colposcopy  2011    History   Social History  . Marital Status: Married    Spouse Name: N/A    Number of Children: N/A  . Years of Education: N/A   Occupational History  . Not on file.   Social History Main Topics  . Smoking status: Never Smoker   . Smokeless tobacco: Never Used  . Alcohol Use: No     Comment: nothing since pregnancy  . Drug Use: No  . Sexually Active: Yes    Birth Control/ Protection: None   Other Topics Concern  . Not on file   Social History Narrative  . No narrative on file    Family History  Problem Relation Age of Onset  . Other Neg Hx   . Depression Mother   . Hypertension Mother   . Hypertension Father   . Depression Brother     No current outpatient prescriptions on file.   No current facility-administered medications for this visit.     No Known Allergies  BP 98/62  Pulse 68  Temp(Src) 97.8 F (36.6 C)  Resp 18  Ht 5\' 2"  (1.575 m)  Wt 110 lb (49.896 kg)  BMI 20.11 kg/m2  No results found.   Review of Systems  Constitutional: Negative for fever, chills, diaphoresis, appetite change and fatigue.  HENT: Negative for ear pain, sore throat, trouble swallowing, neck pain and ear discharge.   Eyes: Negative for photophobia, discharge and visual disturbance.  Respiratory: Negative for cough, choking, chest tightness and shortness of breath.   Cardiovascular: Negative for chest pain and palpitations.  Gastrointestinal: Positive for nausea and abdominal pain. Negative for vomiting, diarrhea, constipation, abdominal distention, anal bleeding and rectal pain.  Genitourinary: Negative for dysuria,  frequency and difficulty urinating.  Musculoskeletal: Negative for myalgias and gait problem.  Skin: Negative for color change, pallor and rash.  Neurological: Negative for dizziness, speech difficulty, weakness and numbness.  Hematological: Negative for adenopathy.  Psychiatric/Behavioral: Negative for confusion and agitation. The patient is not nervous/anxious.        Objective:   Physical Exam  Constitutional: She is oriented to person, place, and time. She appears well-developed and well-nourished. No distress.  HENT:  Head: Normocephalic.  Mouth/Throat: Oropharynx is clear and moist. No oropharyngeal exudate.  Eyes: Conjunctivae and EOM are normal. Pupils are equal, round, and reactive to light. No scleral icterus.  Neck: Normal range of motion. Neck supple. No tracheal deviation present.  Cardiovascular: Normal rate, regular rhythm and intact distal pulses.   Pulmonary/Chest: Effort normal and breath sounds normal. No respiratory distress. She exhibits no tenderness.  Abdominal: Soft. She exhibits no distension, no pulsatile liver and no mass. There is no tenderness. There is no rigidity, no guarding, no CVA tenderness, no tenderness at McBurney's point and negative Murphy's sign. Hernia confirmed negative in the right inguinal area and confirmed negative in the left inguinal area.    Genitourinary: No vaginal discharge found.  Musculoskeletal: Normal range of motion. She exhibits no tenderness.  Lymphadenopathy:    She has no cervical adenopathy.       Right: No inguinal adenopathy present.       Left: No inguinal adenopathy present.  Neurological: She is alert and oriented to person, place, and time. No cranial nerve deficit. She exhibits normal muscle tone. Coordination normal.  Skin: Skin is warm and dry. No rash noted. She is not diaphoretic. No erythema.  Psychiatric: She has a normal mood and affect. Her behavior is normal. Judgment and thought content normal.         Assessment:     Right paramedian abdominal wall pain.  Ultrasound suspicious for Spigelian hernia.    Plan:     I long discussion with the patient and her husband.  Numerous questions were answered.  Given my suspicion on ultrasound and location classic first beginning hernia, I think she would benefit from diagnostic laparoscopy.  That would confirm diagnosis allow me to treat the hernia with a mesh repair.  We went over differential diagnosis extensively.  He wondered if a CAT scan would be appropriate.  In such a thin female, I am worried that a subtle hernia would not be found.  I would not change my recommendation given the fact that ultrasound has strong suspicion for it.  The anatomy & physiology of the abdominal wall was discussed.  The pathophysiology of hernias was discussed.  Natural history risks without surgery including progeressive enlargement, pain, incarceration & strangulation was discussed.   Contributors to complications such as smoking, obesity, diabetes, prior surgery, etc were discussed.   I feel the risks of  no intervention will lead to serious problems that outweigh the operative risks; therefore, I recommended surgery to reduce and repair the hernia.  I explained laparoscopic techniques with possible need for an open approach.  I noted the probable use of mesh to patch and/or buttress the hernia repair  Risks such as bleeding, infection, abscess, need for further treatment, heart attack, death, and other risks were discussed.  I noted a good likelihood this will help address the problem.   Goals of post-operative recovery were discussed as well.  Possibility that this will not correct all symptoms was explained.  I stressed the importance of low-impact activity, aggressive pain control, avoiding constipation, & not pushing through pain to minimize risk of post-operative chronic pain or injury. Possibility of reherniation especially with smoking, obesity, diabetes,  immunosuppression, and other health conditions was discussed.  We will work to minimize complications.     An educational handout further explaining the pathology & treatment options was given as well.  Questions were answered.  Trial of heat nonsteroidals and low-impact activity to help decrease sensitivity of the region would be helpful as well.  The patient & husband express understanding & wishes to proceed with surgery.  I would do activity as tolerated.  Do not push through pain.  I discussed with them using ice/heat/nonsteroidals round-the-clock to decrease density activity in the region.  I noted this was purely musculoskeletal that should resolve the symptoms in a few weeks.  However, with evidence of a hernia on ultrasound and classic location, I more inclined to recommended diagnostic laparoscopy in a young female who is only going to have this thing get worse over time.  They seem more inclined to proceed with surgery but will let me know.

## 2012-11-26 NOTE — Patient Instructions (Addendum)
See the Handout(s) we gave you.  Consider surgery.  Please call our office at [phone] if you wish to schedule surgery or if you have further questions / concerns.   Managing Pain  Pain after surgery or related to activity is often due to strain/injury to muscle, tendon, nerves and/or incisions.  This pain is usually short-term and will improve in a few months.   Many people find it helpful to do the following things TOGETHER to help speed the process of healing and to get back to regular activity more quickly:  1. Avoid heavy physical activity a.  no lifting greater than 20 pounds b. Do not "push through" the pain.  Listen to your body and avoid positions and maneuvers than reproduce the pain c. Walking is okay as tolerated, but go slowly and stop when getting sore.  d. Remember: If it hurts to do it, then don't do it! 2. Take Anti-inflammatory medication  a. Take with food/snack around the clock for 1-2 weeks i. This helps the muscle and nerve tissues become less irritable and calm down faster b. Choose ONE of the following over-the-counter medications: i. Naproxen 220mg  tabs (ex. Aleve) 1-2 pills twice a day  ii. Ibuprofen 200mg  tabs (ex. Advil, Motrin) 3-4 pills with every meal and just before bedtime iii. Acetaminophen 500mg  tabs (Tylenol) 1-2 pills with every meal and just before bedtime 3. Use a Heating pad or Ice/Cold Pack a. 4-6 times a day b. May use warm bath/hottub  or showers 4. Try Gentle Massage and/or Stretching  a. at the area of pain many times a day b. stop if you feel pain - do not overdo it  Try these steps together to help you body heal faster and avoid making things get worse.  Doing just one of these things may not be enough.    If you are not getting better after two weeks or are noticing you are getting worse, contact our office for further advice; we may need to re-evaluate you & see what other things we can do to help.     Hernia A hernia occurs  when an internal organ pushes out through a weak spot in the abdominal wall. Hernias most commonly occur in the groin and around the navel. Hernias often can be pushed back into place (reduced). Most hernias tend to get worse over time. Some abdominal hernias can get stuck in the opening (irreducible or incarcerated hernia) and cannot be reduced. An irreducible abdominal hernia which is tightly squeezed into the opening is at risk for impaired blood supply (strangulated hernia). A strangulated hernia is a medical emergency. Because of the risk for an irreducible or strangulated hernia, surgery may be recommended to repair a hernia. CAUSES   Heavy lifting.  Prolonged coughing.  Straining to have a bowel movement.  A cut (incision) made during an abdominal surgery. HOME CARE INSTRUCTIONS   Bed rest is not required. You may continue your normal activities.  Avoid lifting more than 10 pounds (4.5 kg) or straining.  Cough gently. If you are a smoker it is best to stop. Even the best hernia repair can break down with the continual strain of coughing. Even if you do not have your hernia repaired, a cough will continue to aggravate the problem.  Do not wear anything tight over your hernia. Do not try to keep it in with an outside bandage or truss. These can damage abdominal contents if they are trapped within the hernia sac.  Eat a normal diet.  Avoid constipation. Straining over long periods of time will increase hernia size and encourage breakdown of repairs. If you cannot do this with diet alone, stool softeners may be used. SEEK IMMEDIATE MEDICAL CARE IF:   You have a fever.  You develop increasing abdominal pain.  You feel nauseous or vomit.  Your hernia is stuck outside the abdomen, looks discolored, feels hard, or is tender.  You have any changes in your bowel habits or in the hernia that are unusual for you.  You have increased pain or swelling around the hernia.  You cannot push  the hernia back in place by applying gentle pressure while lying down. MAKE SURE YOU:   Understand these instructions.  Will watch your condition.  Will get help right away if you are not doing well or get worse. Document Released: 08/26/2005 Document Revised: 11/18/2011 Document Reviewed: 04/14/2008 [facility] Patient Information 2013 [place], [place].  HERNIA REPAIR: POST OP INSTRUCTIONS  1. DIET: Follow a light bland diet the first 24 hours after arrival home, such as soup, liquids, crackers, etc.  Be sure to include lots of fluids daily.  Avoid fast food or heavy meals as your are more likely to get nauseated.  Eat a low fat the next few days after surgery. 2. Take your usually prescribed home medications unless otherwise directed. 3. PAIN CONTROL: a. Pain is best controlled by a usual combination of three different methods TOGETHER: i. Ice/Heat ii. Over the counter pain medication iii. Prescription pain medication b. Most patients will experience some swelling and bruising around the hernia(s) such as the bellybutton, groins, or old incisions.  Ice packs or heating pads (30-60 minutes up to 6 times a day) will help. Use ice for the first few days to help decrease swelling and bruising, then switch to heat to help relax tight/sore spots and speed recovery.  Some people prefer to use ice alone, heat alone, alternating between ice & heat.  Experiment to what works for you.  Swelling and bruising can take several weeks to resolve.   c. It is helpful to take an over-the-counter pain medication regularly for the first few weeks.  Choose one of the following that works best for you: i. Naproxen (Aleve, etc)  Two 220mg  tabs twice a day ii. Ibuprofen (Advil, etc) Three 200mg  tabs four times a day (every meal & bedtime) iii. Acetaminophen (Tylenol, etc) 325-650mg  four times a day (every meal & bedtime) d. A  prescription for pain medication should be given to you upon discharge.  Take your pain  medication as prescribed.  i. If you are having problems/concerns with the prescription medicine (does not control pain, nausea, vomiting, rash, itching, etc), please call us [phone] to see if we need to switch you to a different pain medicine that will work better for you and/or control your side effect better. ii. If you need a refill on your pain medication, please contact your pharmacy.  They will contact our office to request authorization. Prescriptions will not be filled after 5 pm or on week-ends. 4. Avoid getting constipated.  Between the surgery and the pain medications, it is common to experience some constipation.  Increasing fluid intake and taking a fiber supplement (such as Metamucil, Citrucel, FiberCon, MiraLax, etc) 1-2 times a day regularly will usually help prevent this problem from occurring.  A mild laxative (prune juice, Milk of Magnesia, MiraLax, etc) should be taken according to package directions if there are no bowel  movements after 48 hours.   5. Wash / shower every day.  You may shower over the dressings as they are waterproof.   6. Remove your waterproof bandages 5 days after surgery.  You may leave the incision open to air.  You may replace a dressing/Band-Aid to cover the incision for comfort if you wish.  Continue to shower over incision(s) after the dressing is off.    7. ACTIVITIES as tolerated:   a. You may resume regular (light) daily activities beginning the next day-such as daily self-care, walking, climbing stairs-gradually increasing activities as tolerated.  If you can walk 30 minutes without difficulty, it is safe to try more intense activity such as jogging, treadmill, bicycling, low-impact aerobics, swimming, etc. b. Save the most intensive and strenuous activity for last such as sit-ups, heavy lifting, contact sports, etc  Refrain from any heavy lifting or straining until you are off narcotics for pain control.   c. DO NOT PUSH THROUGH PAIN.  Let pain  be your guide: If it hurts to do something, don't do it.  Pain is your body warning you to avoid that activity for another week until the pain goes down. d. You may drive when you are no longer taking prescription pain medication, you can comfortably wear a seatbelt, and you can safely maneuver your car and apply brakes. e. Bonita Quin may have sexual intercourse when it is comfortable.  8. FOLLOW UP in our office a. Please call [facility] at [phone] to set up an appointment to see your surgeon in the office for a follow-up appointment approximately 2-3 weeks after your surgery. b. Make sure that you call for this appointment the day you arrive home to insure a convenient appointment time. 9.  IF YOU HAVE DISABILITY OR FAMILY LEAVE FORMS, BRING THEM TO THE OFFICE FOR PROCESSING.  DO NOT GIVE THEM TO YOUR DOCTOR.  WHEN TO CALL us [phone]: 1. Poor pain control 2. Reactions / problems with new medications (rash/itching, nausea, etc)  3. Fever over 101.5 F (38.5 C) 4. Inability to urinate 5. Nausea and/or vomiting 6. Worsening swelling or bruising 7. Continued bleeding from incision. 8. Increased pain, redness, or drainage from the incision   The clinic staff is available to answer your questions during regular business hours (8:30am-5pm).  Please don't hesitate to call and ask to speak to one of our nurses for clinical concerns.   If you have a medical emergency, go to the nearest emergency room or call 911.  A surgeon from [facility] is always on call at the hospitals in [facility], [place] [address], [address], [place], [place]  [address] ?  [address] [address], [place], [place]   [address] MAIN: [phone] ? TOLL FREE: [phone] ? FAX: [phone] [email or web address]

## 2012-11-27 ENCOUNTER — Telehealth (INDEPENDENT_AMBULATORY_CARE_PROVIDER_SITE_OTHER): Payer: Self-pay | Admitting: *Deleted

## 2012-11-27 NOTE — Telephone Encounter (Signed)
Message copied by Consuelo Pandy on Fri Nov 27, 2012  1:35 PM ------      Message from: Ardeth Sportsman      Created: Fri Nov 27, 2012  1:15 PM       As I discussed with her in the office, diagnostic laparoscopy will evaluate the one suspected hernia and make sure there are no  hernias elsewhere.  I am not confident a CT scan preoperatively will change my recommendations nor be adequate to r/o hernias.  I felt no hernias elsewhere.  She is thin enough that I did not feel a hernia at the umbilicus nor in the inguinal regions.  I ask that she trust my clinical judgment is a Careers adviser that manages hernias on a daily basis.  She has a right to get a second opinion elsewhere if she does not agree with these recommendations.            ----- Message -----         From: Consuelo Pandy, RN         Sent: 11/27/2012  11:46 AM           To: Ardeth Sportsman, MD, Liliana Cline, CMA            Patient called to ask if she should go through more testing to make sure there are no more hernias prior to scheduling surgery.  I explained to patient that one hernia on the right doesn't guarantee she would have one on the left and additional testing is really warranted.  Patient wanted me to pass message on to Dr. Michaell Cowing to verify he agrees.            Thanks      Haig Prophet       ------

## 2012-11-27 NOTE — Telephone Encounter (Signed)
Patient updated on Dr. Gordy Savers recommendations and agreeable at this time.

## 2012-12-02 ENCOUNTER — Telehealth (INDEPENDENT_AMBULATORY_CARE_PROVIDER_SITE_OTHER): Payer: Self-pay | Admitting: General Surgery

## 2012-12-02 NOTE — Telephone Encounter (Signed)
Patient called in wanting to receive a second opinion from Dr. Dwain Sarna before continuing on w/ surgery.  After speaking with his nurse she explained she needs to speak w/ Dr. Dwain Sarna before making this appt. Informed patient that we would return her call as soon as we can.  She was okay with this.

## 2012-12-03 ENCOUNTER — Telehealth: Payer: Self-pay | Admitting: *Deleted

## 2012-12-03 NOTE — Telephone Encounter (Signed)
Message copied by Jacki Cones C on Thu Dec 03, 2012 11:44 AM ------      Message from: CERESI, MELANIE L      Created: Thu Dec 03, 2012  8:24 AM      Regarding: Hernia surgeon referral      Contact: [phone]       Pt left a vm stating she would like to discuss getting a referral with a doctor in [facility], [facility].  ------

## 2012-12-03 NOTE — Telephone Encounter (Signed)
Pt decided to stay with [facility]- scheduled her with Dr. Dwain Sarna 12/10/12 @ 245.  Pt notified of appt info.

## 2012-12-10 ENCOUNTER — Ambulatory Visit (INDEPENDENT_AMBULATORY_CARE_PROVIDER_SITE_OTHER): Payer: 59 | Admitting: General Surgery

## 2012-12-10 ENCOUNTER — Encounter (INDEPENDENT_AMBULATORY_CARE_PROVIDER_SITE_OTHER): Payer: Self-pay | Admitting: General Surgery

## 2012-12-10 VITALS — BP 102/62 | HR 68 | Resp 18 | Ht 62.0 in | Wt 109.0 lb

## 2012-12-10 DIAGNOSIS — R1011 Right upper quadrant pain: Secondary | ICD-10-CM

## 2012-12-10 NOTE — Progress Notes (Signed)
Subjective:     Patient ID: Rebecca Ware, female   DOB: 1980/12/09, 32 y.o.   MRN: [id number]  HPI This is a 32 year old female who is very healthy and is an avid runner. She about 3.5 months ago gave birth to her son via a spontaneous vaginal delivery. She has recently started running again and hasb been increasing in her mileage. She states that her obstetrician told her at the time of her surgery that "her abdominal wall split". She had one episode of diarrhea while she was running but this was not associated with any abdominal wall pain. She has had right-sided abdominal pain which when she points to her abdominal wall occurs more in the right upper quadrant above her umbilicus. She has not noticed a lump or a bulge in that position at all. She does not notice any symptoms with really any other activity specifically while doing sit-ups or core exercises she did not notice this at all. It always resolves fairly quickly after she is running. There is been no nausea or vomiting associated with this and outside of the one episode of diarrhea which I think is likely unrelated has had no other symptoms. She underwent an ultrasound at Dr. 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 She was seen by one of my partners and comes in today for a second opinion.  Review of Systems     Objective:   Physical Exam  Vitals reviewed. Constitutional: She appears well-developed and well-nourished.  Abdominal: Soft. Normal appearance. There is no tenderness. No hernia. Hernia confirmed negative in the ventral area.       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  I would expect her to notice something besides pain and the location she points at today is not the exact  location of where a true spigelian hernia would be located.  I have seen the static images of the u/s by Dr. Darrick Penna and see the area in question but don't necessarily think this is bowel although could be omentum.  I discussed options from now including observation, further imaging or diagnostic laparoscopy.  I would also expect her to have some symptoms while she is doing core exercises.  I think observation is not best plan.  I also think diagnostic laparoscopy right now is not best step. I recommended repeating US as next step to confirm the findings by Dr. Darrick Penna.

## 2012-12-11 ENCOUNTER — Ambulatory Visit
Admission: RE | Admit: 2012-12-11 | Discharge: 2012-12-11 | Disposition: A | Discharge status: Home / Self Care | Payer: 59 | Source: Ambulatory Visit | Attending: General Surgery | Admitting: General Surgery

## 2012-12-11 ENCOUNTER — Other Ambulatory Visit (INDEPENDENT_AMBULATORY_CARE_PROVIDER_SITE_OTHER): Payer: Self-pay | Admitting: General Surgery

## 2012-12-11 DIAGNOSIS — R1011 Right upper quadrant pain: Secondary | ICD-10-CM

## 2012-12-15 ENCOUNTER — Telehealth (INDEPENDENT_AMBULATORY_CARE_PROVIDER_SITE_OTHER): Payer: Self-pay | Admitting: *Deleted

## 2012-12-15 ENCOUNTER — Telehealth (INDEPENDENT_AMBULATORY_CARE_PROVIDER_SITE_OTHER): Payer: Self-pay

## 2012-12-15 NOTE — Telephone Encounter (Signed)
Called pt to notify her the abdominal u/s did not show a hernia per Dr Dwain Sarna. I offered an appt to come back and see Dr Dwain Sarna which she wanted so we made it for 01/05/13. The pt understands.

## 2012-12-15 NOTE — Telephone Encounter (Signed)
Rebecca Ware, please see message. I can see her when I get back but not before.  i don't think she has a hernia.

## 2012-12-15 NOTE — Telephone Encounter (Signed)
Patient called regarding [place] results.  Patient informed no hernia seen.  Patient asking what she should do next since there is still pain in the area.

## 2012-12-15 NOTE — Telephone Encounter (Signed)
I tried to call and left her a message

## 2012-12-29 ENCOUNTER — Ambulatory Visit (INDEPENDENT_AMBULATORY_CARE_PROVIDER_SITE_OTHER): Payer: 59 | Admitting: Sports Medicine

## 2012-12-29 VITALS — BP 110/60 | Ht 62.0 in | Wt 106.0 lb

## 2012-12-29 DIAGNOSIS — R109 Unspecified abdominal pain: Secondary | ICD-10-CM

## 2012-12-29 NOTE — Progress Notes (Addendum)
  Subjective:    Patient ID: Rebecca Ware, female    DOB: [birth year], 32 y.o.   MRN: [id number]  HPI  Pt presents to clinic for f/u of rt spigelian hernia. Saw Dr. Michaell Cowing- was scheduled for surgery, but cancelled as she wanted a second opinion.   Went to Dr. Dwain Sarna- he evaluated, did not think she had a hernia on u/s, did not schedule surgery.  She is still having rt upper abdominal pain. Running at 9 min mile pace, not painful, but running at 7 min mile pace caused some pain. She can sometimes get pain at rest. The pain location has not changed and is just lateral to the midline but slightly above and at the level of the umbilicus    Review of Systems     Objective:   Physical Exam  NAD No pain with side leg lifts  No pain with easy sit-ups or abdominal crunches She does get pain with direct palpation of the area but there is no clear-cut bulge or mass She does get pain directly over this area with a Valsalva maneuver or with a prolonged crutch  Ultrasound This is repeated today and compared to last visit there is an extremely large hypoechoic area that moves in and out of the field-of-view when she does Valsalva. Today we do not have to have her do a crunch to visualize this on ultrasound as it seems to come up easily with Valsalva maneuver I compared this to the left side and while her aorta is visible easily on the left I do not see any similar sort of change on the left aspect of the abdomen      Assessment & Plan:

## 2012-12-29 NOTE — Assessment & Plan Note (Signed)
Surgeon has not filled this clearly represents a hernia I still feel that it is very likely to be one  Ultrasound today shows a large hypoechoic area that extends 4-6 cm and that is larger than what I measured on her last examination This does come in and out of view with Valsalva maneuver  I would like her to take her images to Dr. Dwain Sarna and have him look again I am still suspicious of a hernia as I do not have any other explanation for her pain pattern

## 2013-01-05 ENCOUNTER — Ambulatory Visit (INDEPENDENT_AMBULATORY_CARE_PROVIDER_SITE_OTHER): Payer: 59 | Admitting: General Surgery

## 2013-01-05 ENCOUNTER — Encounter (INDEPENDENT_AMBULATORY_CARE_PROVIDER_SITE_OTHER): Payer: Self-pay | Admitting: General Surgery

## 2013-01-05 VITALS — BP 118/78 | HR 72 | Temp 97.8°F | Resp 12 | Ht 62.0 in | Wt 107.2 lb

## 2013-01-05 DIAGNOSIS — R109 Unspecified abdominal pain: Secondary | ICD-10-CM

## 2013-01-05 NOTE — Progress Notes (Signed)
Subjective:     Patient ID: Rebecca Ware, female   DOB: [birth year], 32 y.o   MRN: [id number]  HPI 32 yof I saw previously for possible Spigelian hernia.  I have seen u/s stills from Dr Darrick Penna and it does appear there is abnormality.  She underwent radiologic u/s that does not show any hernia present.  She returns today with the same symptoms of pain on right side of abdomen.  This still occurs if she runs hard and for longer.  Otherwise it really does not bother her a lot.  She recently has been coughing a lot and has not really noticed a difference at all.  She comes back in today to discuss her options.  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 She also has other factors that would lead me to believe that this is abdominal wall in nature and not a hernia.  We discussed all the options from here.  She asked about further radiologic testing.  I think the options are just observation or diagnostic laparoscopy at this point.  A negative test would leave [place] in same situation we are now with a sometimes difficult to diagnose hernia.   A positive test would lead to surgery.  I think proceeding with diagnostic laparoscopy is reasonable.  This may end up being negative and I will not have a reason for her pain. If there is a hernia present then will plan on repairing with mesh.  We discussed this procedure and mesh permanence.  Risks include but are not limited to bleeding, infection, recurrence, chronic pain, suture site pain, injury to surrounding structures requiring repair.  She understands her options.  I think not proceeding with laparoscopy and ending up with incarcerated hernia is the most risky of all options.  We will proceed with diagnostic laparoscopy and mesh hernia repair if necessary

## 2013-01-07 ENCOUNTER — Encounter (HOSPITAL_COMMUNITY): Payer: Self-pay | Admitting: Pharmacy Technician

## 2013-01-15 ENCOUNTER — Encounter (HOSPITAL_COMMUNITY): Payer: Self-pay

## 2013-01-15 ENCOUNTER — Encounter (HOSPITAL_COMMUNITY)
Admission: RE | Admit: 2013-01-15 | Discharge: 2013-01-15 | Disposition: A | Discharge status: Home / Self Care | Payer: 59 | Source: Ambulatory Visit | Attending: General Surgery | Admitting: General Surgery

## 2013-01-15 ENCOUNTER — Other Ambulatory Visit (HOSPITAL_COMMUNITY): Payer: Self-pay | Admitting: *Deleted

## 2013-01-15 LAB — BASIC METABOLIC PANEL
BUN: 13 mg/dL (ref 6–23)
CO2: 31 mEq/L (ref 19–32)
Calcium: 9.2 mg/dL (ref 8.4–10.5)
GFR calc non Af Amer: >90 mL/min (ref >90)
Glucose, Bld: 82 mg/dL (ref 70–99)
Potassium: 3.8 mEq/L (ref 3.5–5.1)

## 2013-01-15 LAB — CBC WITH DIFFERENTIAL/PLATELET
Basophils Relative: 1 % (ref 0–1)
Eosinophils Absolute: 0.3 10*3/uL (ref 0.0–0.7)
Eosinophils Relative: 5 % (ref 0–5)
Hemoglobin: 12.1 g/dL (ref 12.0–15.0)
Lymphs Abs: 2 10*3/uL (ref 0.7–4.0)
MCH: 25.3 pg — ABNORMAL LOW (ref 26.0–34.0)
MCHC: 32.4 g/dL (ref 30.0–36.0)
MCV: 78.1 fL (ref 78.0–100.0)
Monocytes Relative: 7 % (ref 3–12)
Neutrophils Relative %: 59 % (ref 43–77)

## 2013-01-15 LAB — SURGICAL PCR SCREEN
MRSA, PCR: NEGATIVE
Staphylococcus aureus: NEGATIVE

## 2013-01-15 LAB — HCG, SERUM, QUALITATIVE: Preg, Serum: NEGATIVE

## 2013-01-15 NOTE — Patient Instructions (Addendum)
20      Your procedure is scheduled on:  Thursday 01/21/2013  Report to [facility] at 0930  AM.  Call this number if you have problems the morning of surgery: [phone]   Remember:             IF YOU USE CPAP,BRING MASK AND TUBING AM OF SURGERY!   Do not eat food or drink liquids AFTER MIDNIGHT!  Take these medicines the morning of surgery with A SIP OF WATER: NONE   Do not bring valuables to the hospital.  .  Leave suitcase in the car. After surgery it may be brought to your room.  For patients admitted to the hospital, checkout time is 11:00 AM the day of              Discharge.    DO NOT WEAR JEWELRY , MAKE-UP, LOTIONS,POWDERS,PERFUMES!             WOMEN -DO NOT SHAVE LEGS OR UNDERARMS 12 HRS. BEFORE SURGERY!               MEN MAY SHAVE AS USUAL!             CONTACTS,DENTURES OR BRIDGEWORK, FALSE EYELASHES MAY NOT BE WORN INTO SURGERY!                                           Patients discharged the day of surgery will not be allowed to drive home.  If going home the same day of surgery, must have someone stay with you first 24 hrs.at home and arrange for someone to drive you home from the Hospital.                         YOUR DRIVER IS: Michael-spouse   Special Instructions:             Please read over the following fact sheets that you were given:             1. [place] PREPARING FOR SURGERY SHEET              2.[facility] INFORMATION              3.[facility]                                        Telford Nab.Nanney,RN,BSN     [phone]                FAILURE TO FOLLOW THESE INSTRUCTIONS MAY RESULT IN  CANCELLATION OF YOUR SURGERY!               Patient Signature:___________________________

## 2013-01-19 ENCOUNTER — Telehealth (INDEPENDENT_AMBULATORY_CARE_PROVIDER_SITE_OTHER): Payer: Self-pay | Admitting: *Deleted

## 2013-01-19 NOTE — Telephone Encounter (Signed)
Patient called this morning anxious about her surgery.  This RN was able to answer all patients questions and patient seemed to be feeling better following the conversation.  Patient asked about risks of surgery, using mesh or not, and coughing.

## 2013-01-20 NOTE — Progress Notes (Signed)
Spoke with patient and notified her of time change of surgery. Instructed to be at Short Stay at 0830-  States was told 900 by office and would be here after she drops her son off at daycare

## 2013-01-21 ENCOUNTER — Encounter (HOSPITAL_COMMUNITY)
Admission: RE | Disposition: A | Discharge status: Home / Self Care | Payer: Self-pay | Source: Ambulatory Visit | Attending: General Surgery

## 2013-01-21 ENCOUNTER — Encounter (HOSPITAL_COMMUNITY): Payer: Self-pay | Admitting: *Deleted

## 2013-01-21 ENCOUNTER — Ambulatory Visit (HOSPITAL_COMMUNITY)
Admission: RE | Admit: 2013-01-21 | Discharge: 2013-01-21 | Disposition: A | Discharge status: Home / Self Care | Payer: 59 | Source: Ambulatory Visit | Attending: General Surgery | Admitting: General Surgery

## 2013-01-21 ENCOUNTER — Encounter (HOSPITAL_COMMUNITY): Payer: Self-pay | Admitting: Anesthesiology

## 2013-01-21 ENCOUNTER — Ambulatory Visit (HOSPITAL_COMMUNITY): Payer: 59 | Admitting: Anesthesiology

## 2013-01-21 DIAGNOSIS — R109 Unspecified abdominal pain: Secondary | ICD-10-CM

## 2013-01-21 DIAGNOSIS — R059 Cough, unspecified: Secondary | ICD-10-CM | POA: Insufficient documentation

## 2013-01-21 DIAGNOSIS — K4090 Unilateral inguinal hernia, without obstruction or gangrene, not specified as recurrent: Secondary | ICD-10-CM | POA: Insufficient documentation

## 2013-01-21 DIAGNOSIS — R05 Cough: Secondary | ICD-10-CM | POA: Insufficient documentation

## 2013-01-21 HISTORY — PX: LAPAROSCOPY: SHX197

## 2013-01-21 SURGERY — LAPAROSCOPY, DIAGNOSTIC
Anesthesia: General | Site: Abdomen | Wound class: Clean

## 2013-01-21 MED ORDER — 0.9 % SODIUM CHLORIDE (POUR BTL) OPTIME
TOPICAL | Status: DC | PRN
  Administered 2013-01-21: 1000 mL

## 2013-01-21 MED ORDER — FENTANYL CITRATE 0.05 MG/ML IJ SOLN
INTRAMUSCULAR | Status: AC
  Filled 2013-01-21: qty 2

## 2013-01-21 MED ORDER — ONDANSETRON HCL 4 MG/2ML IJ SOLN: 4.0000 mg | Freq: Four times a day (QID) | INTRAMUSCULAR | Status: DC | PRN

## 2013-01-21 MED ORDER — SODIUM CHLORIDE 0.9 % IJ SOLN: 3.0000 mL | INTRAMUSCULAR | Status: DC | PRN

## 2013-01-21 MED ORDER — PROMETHAZINE HCL 25 MG/ML IJ SOLN
6.2500 mg | INTRAMUSCULAR | Status: DC | PRN
  Administered 2013-01-21: 6.25 mg via INTRAVENOUS

## 2013-01-21 MED ORDER — CEFAZOLIN SODIUM-DEXTROSE 2-3 GM-% IV SOLR: 2.0000 g | INTRAVENOUS | Status: DC

## 2013-01-21 MED ORDER — DEXAMETHASONE SODIUM PHOSPHATE 10 MG/ML IJ SOLN
INTRAMUSCULAR | Status: DC | PRN
  Administered 2013-01-21: 5 mg via INTRAVENOUS

## 2013-01-21 MED ORDER — ACETAMINOPHEN 325 MG PO TABS: 650.0000 mg | ORAL_TABLET | ORAL | Status: DC | PRN

## 2013-01-21 MED ORDER — SODIUM CHLORIDE 0.9 % IV SOLN: 250.0000 mL | INTRAVENOUS | Status: DC | PRN

## 2013-01-21 MED ORDER — SODIUM CHLORIDE 0.9 % IV SOLN: INTRAVENOUS | Status: DC

## 2013-01-21 MED ORDER — OXYCODONE-ACETAMINOPHEN 5-325 MG PO TABS
1.0000 | ORAL_TABLET | ORAL | Status: DC | PRN
Start: 2013-01-21 — End: 2013-02-23

## 2013-01-21 MED ORDER — NEOSTIGMINE METHYLSULFATE 1 MG/ML IJ SOLN
INTRAMUSCULAR | Status: DC | PRN
  Administered 2013-01-21: 3 mg via INTRAVENOUS

## 2013-01-21 MED ORDER — LIDOCAINE HCL (CARDIAC) 20 MG/ML IV SOLN
INTRAVENOUS | Status: DC | PRN
  Administered 2013-01-21: 100 mg via INTRAVENOUS

## 2013-01-21 MED ORDER — FENTANYL CITRATE 0.05 MG/ML IJ SOLN
25.0000 ug | INTRAMUSCULAR | Status: DC | PRN
  Administered 2013-01-21: 50 ug via INTRAVENOUS

## 2013-01-21 MED ORDER — METOCLOPRAMIDE HCL 5 MG/ML IJ SOLN
INTRAMUSCULAR | Status: DC | PRN
  Administered 2013-01-21: 10 mg via INTRAVENOUS

## 2013-01-21 MED ORDER — CEFAZOLIN SODIUM-DEXTROSE 2-3 GM-% IV SOLR
2.0000 g | INTRAVENOUS | Status: AC
  Administered 2013-01-21: 2 g via INTRAVENOUS

## 2013-01-21 MED ORDER — PROPOFOL 10 MG/ML IV BOLUS
INTRAVENOUS | Status: DC | PRN
  Administered 2013-01-21: 170 mg via INTRAVENOUS

## 2013-01-21 MED ORDER — ACETAMINOPHEN 650 MG RE SUPP
650.0000 mg | RECTAL | Status: DC | PRN
  Filled 2013-01-21: qty 1

## 2013-01-21 MED ORDER — ACETAMINOPHEN 10 MG/ML IV SOLN
INTRAVENOUS | Status: DC | PRN
  Administered 2013-01-21: 1000 mg via INTRAVENOUS

## 2013-01-21 MED ORDER — SUCCINYLCHOLINE CHLORIDE 20 MG/ML IJ SOLN
INTRAMUSCULAR | Status: DC | PRN
  Administered 2013-01-21: 100 mg via INTRAVENOUS

## 2013-01-21 MED ORDER — PROMETHAZINE HCL 25 MG/ML IJ SOLN
INTRAMUSCULAR | Status: AC
  Filled 2013-01-21: qty 1

## 2013-01-21 MED ORDER — MIDAZOLAM HCL 5 MG/5ML IJ SOLN
INTRAMUSCULAR | Status: DC | PRN
  Administered 2013-01-21: 2 mg via INTRAVENOUS

## 2013-01-21 MED ORDER — FENTANYL CITRATE 0.05 MG/ML IJ SOLN
INTRAMUSCULAR | Status: DC | PRN
  Administered 2013-01-21 (×2): 50 ug via INTRAVENOUS

## 2013-01-21 MED ORDER — GLYCOPYRROLATE 0.2 MG/ML IJ SOLN
INTRAMUSCULAR | Status: DC | PRN
  Administered 2013-01-21: 0.4 mg via INTRAVENOUS

## 2013-01-21 MED ORDER — LACTATED RINGERS IV SOLN: INTRAVENOUS | Status: DC

## 2013-01-21 MED ORDER — MORPHINE SULFATE 10 MG/ML IJ SOLN: 2.0000 mg | INTRAMUSCULAR | Status: DC | PRN

## 2013-01-21 MED ORDER — OXYCODONE HCL 5 MG PO TABS: 5.0000 mg | ORAL_TABLET | ORAL | Status: DC | PRN

## 2013-01-21 MED ORDER — ROCURONIUM BROMIDE 100 MG/10ML IV SOLN
INTRAVENOUS | Status: DC | PRN
  Administered 2013-01-21: 20 mg via INTRAVENOUS

## 2013-01-21 MED ORDER — KETOROLAC TROMETHAMINE 15 MG/ML IJ SOLN
15.0000 mg | Freq: Four times a day (QID) | INTRAMUSCULAR | Status: DC
  Administered 2013-01-21: 15 mg via INTRAVENOUS

## 2013-01-21 MED ORDER — LACTATED RINGERS IV SOLN
INTRAVENOUS | Status: DC
  Administered 2013-01-21: 11:00:00 via INTRAVENOUS
  Administered 2013-01-21: 1000 mL via INTRAVENOUS

## 2013-01-21 MED ORDER — ONDANSETRON HCL 4 MG/2ML IJ SOLN
INTRAMUSCULAR | Status: DC | PRN
  Administered 2013-01-21 (×2): 4 mg via INTRAVENOUS

## 2013-01-21 MED ORDER — MEPERIDINE HCL 50 MG/ML IJ SOLN: 6.2500 mg | INTRAMUSCULAR | Status: DC | PRN

## 2013-01-21 MED ORDER — SODIUM CHLORIDE 0.9 % IJ SOLN: 3.0000 mL | Freq: Two times a day (BID) | INTRAMUSCULAR | Status: DC

## 2013-01-21 MED ORDER — KETOROLAC TROMETHAMINE 15 MG/ML IJ SOLN
INTRAMUSCULAR | Status: AC
  Filled 2013-01-21: qty 1

## 2013-01-21 MED ORDER — BUPIVACAINE-EPINEPHRINE 0.25% -1:200000 IJ SOLN
INTRAMUSCULAR | Status: DC | PRN
  Administered 2013-01-21: 6 mL

## 2013-01-21 SURGICAL SUPPLY — 82 items
ADH SKN CLS APL DERMABOND .7 (GAUZE/BANDAGES/DRESSINGS) ×4
APPLIER CLIP 5 13 M/L LIGAMAX5 (MISCELLANEOUS)
APPLIER CLIP ROT 10 11.4 M/L (STAPLE)
APR CLP MED LRG 11.4X10 (STAPLE)
APR CLP MED LRG 5 ANG JAW (MISCELLANEOUS)
BINDER ABD UNIV 12 45-62 (WOUND CARE) IMPLANT
BINDER ABDOMINAL 46IN 62IN (WOUND CARE)
BLADE EXTENDED COATED 6.5IN (ELECTRODE) IMPLANT
BLADE HEX COATED 2.75 (ELECTRODE) IMPLANT
BLADE SURG SZ10 CARB STEEL (BLADE) IMPLANT
BLADE SURG SZ11 CARB STEEL (BLADE) ×3 IMPLANT
CABLE HIGH FREQUENCY MONO STRZ (ELECTRODE) ×3 IMPLANT
CANISTER SUCTION 2500CC (MISCELLANEOUS) ×1 IMPLANT
CANNULA ENDOPATH XCEL 11M (ENDOMECHANICALS) IMPLANT
CLIP APPLIE 5 13 M/L LIGAMAX5 (MISCELLANEOUS) IMPLANT
CLIP APPLIE ROT 10 11.4 M/L (STAPLE) IMPLANT
CLOTH BEACON ORANGE TIMEOUT ST (SAFETY) ×3 IMPLANT
COVER MAYO STAND STRL (DRAPES) ×3 IMPLANT
DECANTER SPIKE VIAL GLASS SM (MISCELLANEOUS) ×3 IMPLANT
DERMABOND ADVANCED (GAUZE/BANDAGES/DRESSINGS) ×2
DERMABOND ADVANCED .7 DNX12 (GAUZE/BANDAGES/DRESSINGS) ×3 IMPLANT
DEVICE SECURE STRAP 25 ABSORB (INSTRUMENTS) IMPLANT
DEVICE TROCAR PUNCTURE CLOSURE (ENDOMECHANICALS) IMPLANT
DRAPE INCISE IOBAN 66X45 STRL (DRAPES) ×3 IMPLANT
DRAPE LAPAROSCOPIC ABDOMINAL (DRAPES) ×3 IMPLANT
DRAPE WARM FLUID 44X44 (DRAPE) IMPLANT
ELECT REM PT RETURN 9FT ADLT (ELECTROSURGICAL) ×3
ELECTRODE REM PT RTRN 9FT ADLT (ELECTROSURGICAL) ×2 IMPLANT
FILTER SMOKE EVAC LAPAROSHD (FILTER) IMPLANT
GLOVE BIO SURGEON STRL SZ7 (GLOVE) ×3 IMPLANT
GLOVE BIOGEL M 7.0 STRL (GLOVE) ×3 IMPLANT
GLOVE BIOGEL PI IND STRL 7.0 (GLOVE) ×2 IMPLANT
GLOVE BIOGEL PI IND STRL 7.5 (GLOVE) ×2 IMPLANT
GLOVE BIOGEL PI INDICATOR 7.0 (GLOVE) ×1
GLOVE BIOGEL PI INDICATOR 7.5 (GLOVE) ×1
GOWN PREVENTION PLUS LG XLONG (DISPOSABLE) ×3 IMPLANT
GOWN PREVENTION PLUS XLARGE (GOWN DISPOSABLE) ×3 IMPLANT
GOWN STRL NON-REIN LRG LVL3 (GOWN DISPOSABLE) ×3 IMPLANT
GOWN STRL REIN XL XLG (GOWN DISPOSABLE) ×3 IMPLANT
KIT BASIN OR (CUSTOM PROCEDURE TRAY) ×3 IMPLANT
LIGASURE IMPACT 36 18CM CVD LR (INSTRUMENTS) IMPLANT
MARKER SKIN DUAL TIP RULER LAB (MISCELLANEOUS) ×3 IMPLANT
NDL SPNL 22GX3.5 QUINCKE BK (NEEDLE) ×1 IMPLANT
NEEDLE SPNL 22GX3.5 QUINCKE BK (NEEDLE) ×3 IMPLANT
NS IRRIG 1000ML POUR BTL (IV SOLUTION) ×3 IMPLANT
PENCIL BUTTON HOLSTER BLD 10FT (ELECTRODE) IMPLANT
SCALPEL HARMONIC ACE (MISCELLANEOUS) IMPLANT
SCISSORS LAP 5X35 DISP (ENDOMECHANICALS) ×3 IMPLANT
SET IRRIG TUBING LAPAROSCOPIC (IRRIGATION / IRRIGATOR) IMPLANT
SOLUTION ANTI FOG 6CC (MISCELLANEOUS) ×3 IMPLANT
SPONGE GAUZE 4X4 12PLY (GAUZE/BANDAGES/DRESSINGS) ×3 IMPLANT
SPONGE LAP 18X18 X RAY DECT (DISPOSABLE) IMPLANT
STAPLER VISISTAT 35W (STAPLE) ×3 IMPLANT
STRIP CLOSURE SKIN 1/2X4 (GAUZE/BANDAGES/DRESSINGS) ×2 IMPLANT
SUCTION POOLE TIP (SUCTIONS) IMPLANT
SUT MNCRL AB 4-0 PS2 18 (SUTURE) ×5 IMPLANT
SUT PDS AB 1 TP1 96 (SUTURE) IMPLANT
SUT PROLENE 0 CT 1 CR/8 (SUTURE) IMPLANT
SUT PROLENE 2 0 KS (SUTURE) IMPLANT
SUT PROLENE 2 0 SH DA (SUTURE) IMPLANT
SUT SILK 2 0 (SUTURE)
SUT SILK 2 0 SH CR/8 (SUTURE) IMPLANT
SUT SILK 2-0 18XBRD TIE 12 (SUTURE) IMPLANT
SUT SILK 3 0 (SUTURE)
SUT SILK 3 0 SH CR/8 (SUTURE) IMPLANT
SUT SILK 3-0 18XBRD TIE 12 (SUTURE) IMPLANT
SUT VIC AB 2-0 CT1 27 (SUTURE) ×3
SUT VIC AB 2-0 CT1 TAPERPNT 27 (SUTURE) ×1 IMPLANT
SYS LAPSCP GELPORT 120MM (MISCELLANEOUS)
SYSTEM LAPSCP GELPORT 120MM (MISCELLANEOUS) IMPLANT
TACKER 5MM HERNIA 3.5CML NAB (ENDOMECHANICALS) ×3 IMPLANT
TOWEL OR 17X26 10 PK STRL BLUE (TOWEL DISPOSABLE) ×3 IMPLANT
TRAY FOLEY CATH 14FRSI W/METER (CATHETERS) ×3 IMPLANT
TRAY LAP CHOLE (CUSTOM PROCEDURE TRAY) ×3 IMPLANT
TROCAR BALLN 12MMX100 BLUNT (TROCAR) IMPLANT
TROCAR BLADELESS OPT 5 75 (ENDOMECHANICALS) ×6 IMPLANT
TROCAR XCEL 12X100 BLDLESS (ENDOMECHANICALS) IMPLANT
TROCAR XCEL BLUNT TIP 100MML (ENDOMECHANICALS) IMPLANT
TROCAR XCEL NON-BLD 11X100MML (ENDOMECHANICALS) ×3 IMPLANT
TUBING INSUFFLATION 10FT LAP (TUBING) ×3 IMPLANT
YANKAUER SUCT BULB TIP 10FT TU (MISCELLANEOUS) IMPLANT
YANKAUER SUCT BULB TIP NO VENT (SUCTIONS) IMPLANT

## 2013-01-21 NOTE — Anesthesia Postprocedure Evaluation (Signed)
  Anesthesia Post-op Note  Patient: Rebecca Ware  Procedure(s) Performed: Procedure(s) (LRB): LAPAROSCOPY DIAGNOSTIC (N/A)  Patient Location: PACU  Anesthesia Type: General  Level of Consciousness: awake and alert   Airway and Oxygen Therapy: Patient Spontanous Breathing  Post-op Pain: mild  Post-op Assessment: Post-op Vital signs reviewed, Patient's Cardiovascular Status Stable, Respiratory Function Stable, Patent Airway and No signs of Nausea or vomiting  Last Vitals:  Filed Vitals:   01/21/13 1353  BP: 103/64  Pulse:   Temp:   Resp: 16    Post-op Vital Signs: stable   Complications: No apparent anesthesia complications

## 2013-01-21 NOTE — Progress Notes (Signed)
Spoke to dr gross re: consent order on chart dated today.  Per dr gross, follow dr Kerry Dory orders

## 2013-01-21 NOTE — Anesthesia Preprocedure Evaluation (Addendum)

## 2013-01-21 NOTE — H&P (View-Only) (Signed)
Subjective:     Patient ID: Rebecca Ware, female   DOB: 08/27/1981, 32 y.o.   MRN: [id number]  HPI 32 yof I saw previously for possible Spigelian hernia.  I have seen u/s stills from Dr Fields and it does appear there is abnormality.  She underwent radiologic u/s that does not show any hernia present.  She returns today with the same symptoms of pain on right side of abdomen.  This still occurs if she runs hard and for longer.  Otherwise it really does not bother her a lot.  She recently has been coughing a lot and has not really noticed a difference at all.  She comes back in today to discuss her options.  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 She also has other factors that would lead me to believe that this is abdominal wall in nature and not a hernia.  We discussed all the options from here.  She asked about further radiologic testing.  I think the options are just observation or diagnostic laparoscopy at this point.  A negative test would leave us in same situation we are now with a sometimes difficult to diagnose hernia.   A positive test would lead to surgery.  I think proceeding with diagnostic laparoscopy is reasonable.  This may end up being negative and I will not have a reason for her pain. If there is a hernia present then will plan on repairing with mesh.  We discussed this procedure and mesh permanence.  Risks include but are not limited to bleeding, infection, recurrence, chronic pain, suture site pain, injury to surrounding structures requiring repair.  She understands her options.  I think not proceeding with laparoscopy and ending up with incarcerated hernia is the most risky of all options.  We will proceed with diagnostic laparoscopy and mesh hernia repair if necessary       

## 2013-01-21 NOTE — Transfer of Care (Signed)
Immediate Anesthesia Transfer of Care Note  Patient: Rebecca Ware  Procedure(s) Performed: Procedure(s): LAPAROSCOPY DIAGNOSTIC (N/A)  Patient Location: PACU  Anesthesia Type:General  Level of Consciousness: alert , oriented and patient cooperative  Airway & Oxygen Therapy: Patient Spontanous Breathing and Patient connected to face mask oxygen  Post-op Assessment: Report given to PACU RN, Post -op Vital signs reviewed and stable and Patient moving all extremities  Post vital signs: Reviewed and stable  Complications: No apparent anesthesia complications

## 2013-01-21 NOTE — Interval H&P Note (Signed)
History and Physical Interval Note:  01/21/2013 10:42 AM  Rebecca Ware  has presented today for surgery, with the diagnosis of ventral hernia  The various methods of treatment have been discussed with the patient and family. After consideration of risks, benefits and other options for treatment, the patient has consented to  Procedure(s):  POSSIBLE LAPAROSCOPIC VENTRAL HERNIA REPAIR WITH MESH (N/A) LAPAROSCOPY DIAGNOSTIC (N/A) as a surgical intervention .  The patient's history has been reviewed, patient examined, no change in status, stable for surgery.  I have reviewed the patient's chart and labs.  Questions were answered to the patient's satisfaction.     Colsen Modi

## 2013-01-21 NOTE — Op Note (Signed)
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 Harden Mo Anesthesia: Gen. Endotracheal Specimens: None Estimated blood loss: Minimal Complications: None Drains: None Sponge and needle count correct x2 interoperation Disposition to recovery in stable condition  Indication: This is a 32 year old female patient of Dr. Darrick Penna who is an avid long distance runner. Since the delivery of her child about 5 months ago she has had right-sided abdominal wall pain. This is primarily when she runs longer distance. She was evaluated by Dr. Darrick Penna and I've seen the ultrasound pictures as well and it does certainly appears suspicious that she may have a spigelian hernia. She has not felt a mass and this is not evident on her examination. An ultrasound in radiology did not show a spigelian hernia. Due to the persistence of her symptoms as well as the possibility of a spigelian hernia given the difficulty in diagnosis of these hernias she and I discussed a diagnostic laparoscopy with repair of this hernia with mesh if indicated.  Procedure: After informed consent was obtained the patient was taken to the operating room. She was administered cefazolin. Sequential compression devices were placed on her legs. She was then prepped and draped in the standard sterile surgical fashion. Rebecca Ware was placed over abdominal wall. An orogastric tube had been placed. A Foley catheter was also placed. I then infiltrated Marcaine into her abdominal wall. I made a small incision her left upper quadrant. I was going to attempt to use an Optiview technique but due to abdominal wall I began to do this and elected quickly to proceed with open placement of 5 mm trocar. I pulled up her posterior sheath as well as the peritoneum and made small cuts in both of these. I then inserted a 5 mm trocar and insufflated  the abdomen to 15 mm mercury pressure. I switched to the angled scope at that point. I evaluated her entire abdominal wall. Pictures were taken and these had been placed in the chart as well as what should be 2 in her computer record as well. Specifically there is no evidence of a right inguinal hernia. There is no evidence of a abdominal wall hernia. There is no evidence of a right-sided spigelian hernia in the region where she had pain. I can clearly see the arcuate line as well as the semilunar line. There is no defect in the peritoneum in any position at all. There is no evidence of the peritoneum being incarcerated inside a hernia at all either. She does have a very small left inguinal hernia which I took a picture of I do not think that this is the source of her symptoms and she may never be aware that this is present. I elected to remove the camera. I then desufflated her abdomen. I closed the peritoneum with 2-0 Vicryl. I closed her fashion with 2-0 Vicryl. I then closed the skin with 4-0 Monocryl and Dermabond. Steri-Strips are placed on this. She tolerated this well as extubated and transferred to recovery stable.

## 2013-01-22 ENCOUNTER — Encounter (HOSPITAL_COMMUNITY): Payer: Self-pay | Admitting: General Surgery

## 2013-02-03 ENCOUNTER — Telehealth (INDEPENDENT_AMBULATORY_CARE_PROVIDER_SITE_OTHER): Payer: Self-pay

## 2013-02-03 NOTE — Telephone Encounter (Signed)
Called pt to give her the f/u appt with Dr Dwain Sarna for 02/23/13 at 4:00. The pt wants to speak to Dr Dwain Sarna so she asked for him to call her so she can discuss some things with him about her job. I will notifiy Dr Dwain Sarna.

## 2013-02-23 ENCOUNTER — Encounter (INDEPENDENT_AMBULATORY_CARE_PROVIDER_SITE_OTHER): Payer: Self-pay | Admitting: General Surgery

## 2013-02-23 ENCOUNTER — Encounter (INDEPENDENT_AMBULATORY_CARE_PROVIDER_SITE_OTHER): Payer: 59 | Admitting: General Surgery

## 2013-02-23 ENCOUNTER — Ambulatory Visit (INDEPENDENT_AMBULATORY_CARE_PROVIDER_SITE_OTHER): Payer: 59 | Admitting: General Surgery

## 2013-02-23 VITALS — BP 112/64 | HR 64 | Temp 97.2°F | Resp 16 | Ht 62.0 in | Wt 105.6 lb

## 2013-02-23 DIAGNOSIS — Z09 Encounter for follow-up examination after completed treatment for conditions other than malignant neoplasm: Secondary | ICD-10-CM

## 2013-02-24 NOTE — Progress Notes (Signed)
Subjective:     Patient ID: Rebecca Ware, female   DOB: 1981/05/04, 32 y.o.   MRN: [id number]  HPI 63 yof thin runner who had right sided ab wall pain and possible spigelian hernia.  I took to or recently for diagnostic laparoscopy with no evidence of a spigelian hernia.  She has tiny rih that I think will never become consequential and does not need repair.  She comes in today doing well without complaints.  She still has some ab wall pain.  No problems after surgery. We reviewed pictures from operation today.  Review of Systems     Objective:   Physical Exam Ab soft, well healed luq incision    Assessment:     S/p dx lsc      Plan:     There is no evidence of hernia.  She will continue treatment per Dr Darrick Penna for ab wall pain.  We discussed conservative measures today.  She can exercise without restriction.

## 2013-06-16 ENCOUNTER — Encounter: Payer: 59 | Admitting: Sports Medicine

## 2013-10-07 ENCOUNTER — Encounter: Payer: 59 | Admitting: Sports Medicine

## 2013-12-28 ENCOUNTER — Ambulatory Visit (INDEPENDENT_AMBULATORY_CARE_PROVIDER_SITE_OTHER): Payer: 59 | Admitting: Sports Medicine

## 2013-12-28 ENCOUNTER — Encounter: Payer: Self-pay | Admitting: Sports Medicine

## 2013-12-28 VITALS — BP 121/83 | HR 64 | Ht 62.0 in | Wt 105.0 lb

## 2013-12-28 DIAGNOSIS — M79609 Pain in unspecified limb: Secondary | ICD-10-CM

## 2013-12-28 NOTE — Assessment & Plan Note (Signed)
Compression sleeve of the calf  Heel lifts bilaterally until we can get her in and remained her orthotics  Heel rehabilitation protocol Begin easy running once pain free and progress gradually over the next 6 weeks  Recheck in 6 weeks unless completely pain free  We will make new orthotics in the next couple weeks/ these show orthotic fracture

## 2013-12-28 NOTE — Patient Instructions (Signed)
Please start doing rehab exercises daily  Use calf compression sleeve for activity and for 1/2-1 hour afterwards  Schedule an appointment with one of the fellows for new orthotics  Start out with running 3-4 miles easy- for 1 week, if this does not cause problems, ok to increase to 5 miles the next week  Please follow up with Dr. Darrick PennaFields in 6 weeks

## 2013-12-28 NOTE — Progress Notes (Signed)
Patient ID: Alejandro MullingJennifer Lusignan, female   DOB: 1981/04/30, 33 y.o.   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  Resting since injury/ used wedge shoe  Orthotics are now 35 years old and don't feel that supportive   General Fit , nad  BP 121/83  Pulse 64  Ht 5\' 2"  (1.575 m)  Wt 105 lb (47.628 kg)  BMI 19.20 kg/m2  Muscular female who in reexamination the abdomen I can find only a small area of tenderness still in the same area where she had previous injury but excellent muscle strength  Left hip motion is normal Left SI joint motion normal Strength testing of abductors hip flexors and all muscles in the lower extremities are normal  Mild tenderness to palpation at the medial gastrocnemius insertion on the left but no swelling  Running gait is without a limp and excellent form She does feel the calf when she tries to run  Ultrasound  The left medial and lateral gastrocnemius heads are intact On the left there is a small area about 1 cm in width and approximately 2 cm in length with some hypoechoic change at the insertion of the muscle to the fascia No discrete tear is identified

## 2014-01-06 ENCOUNTER — Encounter: Payer: Self-pay | Admitting: Sports Medicine

## 2014-01-06 ENCOUNTER — Ambulatory Visit (INDEPENDENT_AMBULATORY_CARE_PROVIDER_SITE_OTHER): Payer: BC Managed Care – PPO | Admitting: Sports Medicine

## 2014-01-06 VITALS — BP 114/64 | Ht 62.0 in | Wt 105.0 lb

## 2014-01-06 DIAGNOSIS — M2170 Unequal limb length (acquired), unspecified site: Secondary | ICD-10-CM

## 2014-01-06 DIAGNOSIS — M2140 Flat foot [pes planus] (acquired), unspecified foot: Secondary | ICD-10-CM | POA: Diagnosis not present

## 2014-01-06 NOTE — Assessment & Plan Note (Signed)
Because orthotics are made the office today

## 2014-01-06 NOTE — Progress Notes (Signed)
Patient ID: Rebecca MullingJennifer Ware, female   DOB: 12-07-1980, 33 y.o.   MRN: [id number] [age] year old female avid runner who presents for replacement of custom orthotics. She is currently recovering from a left calf muscle strain. She's been running no more than about 5 miles a day. Her pain is improved markedly. She continues to wear a body helix compression sleeve which is helping significantly. She has not had any new foot problems recently. The orthotic shoes wearing her for over 35 years old.  Pertinent past medical history: Leg length discrepancy, pes planus  Review of systems as per history of present illness otherwise all systems negative  Examination: BP 114/64  Ht 5\' 2"  (1.575 m)  Wt 105 lb (47.628 kg)  BMI 19.20 kg/m2  Well-developed well-nourished 33 year old female awake alert oriented no acute distress  Bilateral pes planus. Left leg slightly shorter less than 1 cm difference in the right.  Patient  was fitted for a : standard, cushioned, semi-rigid orthotic. The orthotic was heated and afterward the patient stood on the orthotic blank positioned on the orthotic stand. The patient was positioned in subtalar neutral position and 10 degrees of ankle dorsiflexion in a weight bearing stance. After completion of molding, a stable base was applied to the orthotic blank. The blank was ground to a stable position for weight bearing. Size: 7 Base: blue eva Posting: none Additional orthotic padding: left heel blue padding  Greater than 40 minutes of time was spent face-to-face obtaining history, evaluating feet in addition to the rest of the physical exam and evaluating gait both pre-and post orthotic production, as well as the time spent producing the orthotics.

## 2014-01-06 NOTE — Assessment & Plan Note (Signed)
Heel padding was incorporated into the new custom orthotics

## 2014-02-08 ENCOUNTER — Ambulatory Visit: Payer: Self-pay | Admitting: Sports Medicine

## 2014-04-27 ENCOUNTER — Emergency Department (HOSPITAL_COMMUNITY)
Admission: EM | Admit: 2014-04-27 | Discharge: 2014-04-27 | Disposition: A | Discharge status: Other Acute Inpatient Hospital | Payer: BC Managed Care – PPO

## 2014-07-11 ENCOUNTER — Encounter: Payer: Self-pay | Admitting: Sports Medicine

## 2014-09-09 NOTE — L&D Delivery Note (Signed)
Delivery Note At 11:58 AM a viable and healthy female was delivered via Vaginal, Spontaneous Delivery (Presentation: LOA  ).  APGAR: 9, 9; weight  .   Placenta status: Intact, Spontaneous.  Cord: 3 vessels with the following complications: Knot.  Cord pH: na  Anesthesia: Epidural  Episiotomy: None Lacerations: 2nd degree;Perineal Suture Repair: 2.0 vicryl rapide Est. Blood Loss (mL):  100  Mom to postpartum.  Baby to Couplet care / Skin to Skin.  Rebecca Ware J 08/08/2015, 1:05 PM

## 2015-01-18 LAB — OB RESULTS CONSOLE RUBELLA ANTIBODY, IGM: RUBELLA: IMMUNE

## 2015-01-18 LAB — OB RESULTS CONSOLE ABO/RH: RH Type: NEGATIVE

## 2015-01-18 LAB — OB RESULTS CONSOLE ANTIBODY SCREEN: ANTIBODY SCREEN: NEGATIVE

## 2015-01-18 LAB — OB RESULTS CONSOLE HIV ANTIBODY (ROUTINE TESTING): HIV: NONREACTIVE

## 2015-01-18 LAB — OB RESULTS CONSOLE RPR: RPR: NONREACTIVE

## 2015-01-18 LAB — OB RESULTS CONSOLE HEPATITIS B SURFACE ANTIGEN: HEP B S AG: NEGATIVE

## 2015-07-12 LAB — OB RESULTS CONSOLE GBS: GBS: NEGATIVE

## 2015-08-08 ENCOUNTER — Encounter (HOSPITAL_COMMUNITY): Payer: Self-pay | Admitting: *Deleted

## 2015-08-08 ENCOUNTER — Inpatient Hospital Stay (HOSPITAL_COMMUNITY)
Admission: AD | Admit: 2015-08-08 | Discharge: 2015-08-09 | DRG: 775 | Disposition: A | Discharge status: Home / Self Care | Payer: BLUE CROSS/BLUE SHIELD | Source: Ambulatory Visit | Attending: Obstetrics and Gynecology | Admitting: Obstetrics and Gynecology

## 2015-08-08 ENCOUNTER — Inpatient Hospital Stay (HOSPITAL_COMMUNITY): Payer: BLUE CROSS/BLUE SHIELD | Admitting: Anesthesiology

## 2015-08-08 DIAGNOSIS — O99344 Other mental disorders complicating childbirth: Secondary | ICD-10-CM | POA: Diagnosis present

## 2015-08-08 DIAGNOSIS — Z809 Family history of malignant neoplasm, unspecified: Secondary | ICD-10-CM | POA: Diagnosis not present

## 2015-08-08 DIAGNOSIS — F419 Anxiety disorder, unspecified: Secondary | ICD-10-CM | POA: Diagnosis present

## 2015-08-08 DIAGNOSIS — Z818 Family history of other mental and behavioral disorders: Secondary | ICD-10-CM | POA: Diagnosis not present

## 2015-08-08 DIAGNOSIS — Z8249 Family history of ischemic heart disease and other diseases of the circulatory system: Secondary | ICD-10-CM | POA: Diagnosis not present

## 2015-08-08 DIAGNOSIS — Z3A38 38 weeks gestation of pregnancy: Secondary | ICD-10-CM | POA: Diagnosis not present

## 2015-08-08 LAB — CBC
HCT: 41.6 % (ref 36.0–46.0)
HEMOGLOBIN: 14.1 g/dL (ref 12.0–15.0)
MCH: 27.9 pg (ref 26.0–34.0)
MCHC: 33.9 g/dL (ref 30.0–36.0)
MCV: 82.4 fL (ref 78.0–100.0)
Platelets: 262 10*3/uL (ref 150–400)
RBC: 5.05 MIL/uL (ref 3.87–5.11)
RDW: 13.1 % (ref 11.5–15.5)
WBC: 11.4 10*3/uL — ABNORMAL HIGH (ref 4.0–10.5)

## 2015-08-08 MED ORDER — ZOLPIDEM TARTRATE 5 MG PO TABS: 5.0000 mg | ORAL_TABLET | Freq: Every evening | ORAL | Status: DC | PRN

## 2015-08-08 MED ORDER — BENZOCAINE-MENTHOL 20-0.5 % EX AERO
1.0000 "application " | INHALATION_SPRAY | CUTANEOUS | Status: DC | PRN
  Administered 2015-08-08: 1 via TOPICAL
  Filled 2015-08-08 (×2): qty 56

## 2015-08-08 MED ORDER — CITRIC ACID-SODIUM CITRATE 334-500 MG/5ML PO SOLN
30.0000 mL | ORAL | Status: DC | PRN
Start: 2015-08-08 — End: 2015-08-08

## 2015-08-08 MED ORDER — DIPHENHYDRAMINE HCL 50 MG/ML IJ SOLN: 12.5000 mg | INTRAMUSCULAR | Status: DC | PRN

## 2015-08-08 MED ORDER — SIMETHICONE 80 MG PO CHEW: 80.0000 mg | CHEWABLE_TABLET | ORAL | Status: DC | PRN

## 2015-08-08 MED ORDER — TETANUS-DIPHTH-ACELL PERTUSSIS 5-2.5-18.5 LF-MCG/0.5 IM SUSP: 0.5000 mL | Freq: Once | INTRAMUSCULAR | Status: DC

## 2015-08-08 MED ORDER — DIPHENHYDRAMINE HCL 25 MG PO CAPS: 25.0000 mg | ORAL_CAPSULE | Freq: Four times a day (QID) | ORAL | Status: DC | PRN

## 2015-08-08 MED ORDER — LANOLIN HYDROUS EX OINT: TOPICAL_OINTMENT | CUTANEOUS | Status: DC | PRN

## 2015-08-08 MED ORDER — METHYLERGONOVINE MALEATE 0.2 MG/ML IJ SOLN: 0.2000 mg | INTRAMUSCULAR | Status: DC | PRN

## 2015-08-08 MED ORDER — PHENYLEPHRINE 40 MCG/ML (10ML) SYRINGE FOR IV PUSH (FOR BLOOD PRESSURE SUPPORT)
80.0000 ug | PREFILLED_SYRINGE | INTRAVENOUS | Status: DC | PRN
  Filled 2015-08-08: qty 2
  Filled 2015-08-08: qty 20

## 2015-08-08 MED ORDER — LIDOCAINE HCL (PF) 1 % IJ SOLN
30.0000 mL | INTRAMUSCULAR | Status: DC | PRN
  Filled 2015-08-08: qty 30

## 2015-08-08 MED ORDER — ONDANSETRON HCL 4 MG/2ML IJ SOLN: 4.0000 mg | INTRAMUSCULAR | Status: DC | PRN

## 2015-08-08 MED ORDER — OXYCODONE-ACETAMINOPHEN 5-325 MG PO TABS: 2.0000 | ORAL_TABLET | ORAL | Status: DC | PRN

## 2015-08-08 MED ORDER — ACETAMINOPHEN 325 MG PO TABS: 650.0000 mg | ORAL_TABLET | ORAL | Status: DC | PRN

## 2015-08-08 MED ORDER — IBUPROFEN 600 MG PO TABS
600.0000 mg | ORAL_TABLET | Freq: Four times a day (QID) | ORAL | Status: DC
  Administered 2015-08-08 – 2015-08-09 (×4): 600 mg via ORAL
  Filled 2015-08-08 (×4): qty 1

## 2015-08-08 MED ORDER — ONDANSETRON HCL 4 MG PO TABS: 4.0000 mg | ORAL_TABLET | ORAL | Status: DC | PRN

## 2015-08-08 MED ORDER — LACTATED RINGERS IV SOLN: 500.0000 mL | INTRAVENOUS | Status: DC | PRN

## 2015-08-08 MED ORDER — OXYTOCIN 40 UNITS IN LACTATED RINGERS INFUSION - SIMPLE MED
62.5000 mL/h | INTRAVENOUS | Status: DC
  Filled 2015-08-08: qty 1000

## 2015-08-08 MED ORDER — WITCH HAZEL-GLYCERIN EX PADS: 1.0000 "application " | MEDICATED_PAD | CUTANEOUS | Status: DC | PRN

## 2015-08-08 MED ORDER — FENTANYL 2.5 MCG/ML BUPIVACAINE 1/10 % EPIDURAL INFUSION (WH - ANES)
14.0000 mL/h | INTRAMUSCULAR | Status: DC | PRN
  Administered 2015-08-08 (×2): 14 mL/h via EPIDURAL
  Filled 2015-08-08: qty 125

## 2015-08-08 MED ORDER — ONDANSETRON HCL 4 MG/2ML IJ SOLN: 4.0000 mg | Freq: Four times a day (QID) | INTRAMUSCULAR | Status: DC | PRN

## 2015-08-08 MED ORDER — OXYTOCIN BOLUS FROM INFUSION: 500.0000 mL | INTRAVENOUS | Status: DC

## 2015-08-08 MED ORDER — LACTATED RINGERS IV SOLN
INTRAVENOUS | Status: DC
  Administered 2015-08-08: 10:00:00 via INTRAVENOUS

## 2015-08-08 MED ORDER — OXYCODONE-ACETAMINOPHEN 5-325 MG PO TABS: 1.0000 | ORAL_TABLET | ORAL | Status: DC | PRN

## 2015-08-08 MED ORDER — DIBUCAINE 1 % RE OINT: 1.0000 "application " | TOPICAL_OINTMENT | RECTAL | Status: DC | PRN

## 2015-08-08 MED ORDER — SENNOSIDES-DOCUSATE SODIUM 8.6-50 MG PO TABS
2.0000 | ORAL_TABLET | ORAL | Status: DC
  Administered 2015-08-08: 2 via ORAL
  Filled 2015-08-08: qty 2

## 2015-08-08 MED ORDER — FLEET ENEMA 7-19 GM/118ML RE ENEM: 1.0000 | ENEMA | RECTAL | Status: DC | PRN

## 2015-08-08 MED ORDER — LIDOCAINE HCL (PF) 1 % IJ SOLN
INTRAMUSCULAR | Status: DC | PRN
  Administered 2015-08-08: 5 mL via EPIDURAL
  Administered 2015-08-08: 3 mL via EPIDURAL
  Administered 2015-08-08: 2 mL via EPIDURAL

## 2015-08-08 MED ORDER — PRENATAL MULTIVITAMIN CH: 1.0000 | ORAL_TABLET | Freq: Every day | ORAL | Status: DC

## 2015-08-08 MED ORDER — METHYLERGONOVINE MALEATE 0.2 MG PO TABS: 0.2000 mg | ORAL_TABLET | ORAL | Status: DC | PRN

## 2015-08-08 MED ORDER — EPHEDRINE 5 MG/ML INJ
10.0000 mg | INTRAVENOUS | Status: DC | PRN
  Filled 2015-08-08: qty 2

## 2015-08-08 NOTE — Anesthesia Procedure Notes (Signed)
Epidural Patient location during procedure: OB  Staffing Anesthesiologist: Evyn Kooyman EDWARD Performed by: anesthesiologist   Preanesthetic Checklist Completed: patient identified, pre-op evaluation, timeout performed, IV checked, risks and benefits discussed and monitors and equipment checked  Epidural Patient position: sitting Prep: DuraPrep Patient monitoring: blood pressure and continuous pulse ox Approach: midline Location: L3-L4 Injection technique: LOR air  Needle:  Needle type: Tuohy  Needle gauge: 17 G Needle length: 9 cm Needle insertion depth: 4 cm Catheter size: 19 Gauge Catheter at skin depth: 9 cm Test dose: negative and Other (1% Lidocaine)  Additional Notes Patient identified.  Risk benefits discussed including failed block, incomplete pain control, headache, nerve damage, paralysis, blood pressure changes, nausea, vomiting, reactions to medication both toxic or allergic, and postpartum back pain.  Patient expressed understanding and wished to proceed.  All questions were answered.  Sterile technique used throughout procedure and epidural site dressed with sterile barrier dressing. No paresthesia or other complications noted. The patient did not experience any signs of intravascular injection such as tinnitus or metallic taste in mouth nor signs of intrathecal spread such as rapid motor block. Please see nursing notes for vital signs. Reason for block:procedure for pain   

## 2015-08-08 NOTE — Lactation Note (Signed)
This note was copied from the chart of Rebecca Alejandro MullingJennifer Wetsel. Lactation Consultation Note  Patient Name: Rebecca Alejandro MullingJennifer Shirah WUJWJ'XToday's Date: 08/08/2015 Reason for consult: Initial assessment Baby at 5 hr of life and mom started formula because she was not sure baby was latching well. Her older son was born 1334 wk and she only pumped with him because he would not latch. She stated that she was miserable with pumping every 2 hr around the clock. She does not want to put herself through that again so she is using formula. Discussed feeding frequency, breast changes, nipple care, pumping, and supplementing. Given lactation handouts. She is aware of OP services and support group.    Maternal Data Has patient been taught Hand Expression?: Yes Does the patient have breastfeeding experience prior to this delivery?: Yes  Feeding Feeding Type: Breast Milk with Formula added Length of feed: 0 min  LATCH Score/Interventions Latch: Repeated attempts needed to sustain latch, nipple held in mouth throughout feeding, stimulation needed to elicit sucking reflex. Intervention(s): Assist with latch  Audible Swallowing: A few with stimulation Intervention(s): Skin to skin  Type of Nipple: Everted at rest and after stimulation  Comfort (Breast/Nipple): Soft / non-tender     Hold (Positioning): Assistance needed to correctly position infant at breast and maintain latch.  LATCH Score: 7  Lactation Tools Discussed/Used WIC Program: No   Consult Status Consult Status: Follow-up Date: 08/09/15 Follow-up type: In-patient    Rulon Eisenmengerlizabeth E Rishaan Gunner 08/08/2015, 5:50 PM

## 2015-08-08 NOTE — H&P (Signed)
Alejandro MullingJennifer Loiselle is a 34 y.o. female presenting for labor.  Maternal Medical History:  Reason for admission: Contractions.   Contractions: Onset was 1-2 hours ago.   Frequency: regular.   Perceived severity is moderate.    Fetal activity: Perceived fetal activity is normal.   Last perceived fetal movement was within the past hour.    Prenatal complications: no prenatal complications Prenatal Complications - Diabetes: none.    OB History    Gravida Para Term Preterm AB TAB SAB Ectopic Multiple Living   4 1  1 2  2   1      Past Medical History  Diagnosis Date  . Anxiety 2010    took medication x 6 months  . Abnormal Pap smear 2011    colpo. repeat WNL  . MTHFR mutation (HCC)   . Threatened preterm labor 33 wks 08/17/2012  . Anemia     during pregnancy   Past Surgical History  Procedure Laterality Date  . Dilation and evacuation  03/29/2011    Procedure: DILATATION AND EVACUATION (D&E);  Surgeon: Genia DelMarie-Lyne Lavoie;  Location: [facility];  Service: Gynecology;  Laterality: N/A;  . Dilation and evacuation  07/08/2011    Procedure: DILATATION AND EVACUATION (D&E);  Surgeon: Lenoard Adenichard J Quenesha Douglass, MD;  Location: [facility];  Service: Gynecology;  Laterality: N/A;  . Colposcopy  2011  . Eye surgery  [id number]    age 34  . Laparoscopy N/A 01/21/2013    Procedure: LAPAROSCOPY DIAGNOSTIC;  Surgeon: Emelia LoronMatthew Wakefield, MD;  Location: [facility];  Service: General;  Laterality: N/A;   Family History: family history includes Cancer in her paternal grandfather; Depression in her brother and mother; Hypertension in her father and mother. There is no history of Other. Social History:  reports that she has never smoked. She has never used smokeless tobacco. She reports that she does not drink alcohol or use illicit drugs.   Prenatal Transfer Tool  Maternal Diabetes: No Genetic Screening: Normal Maternal Ultrasounds/Referrals: Normal Fetal Ultrasounds or other Referrals:  None Maternal Substance Abuse:   No Significant Maternal Medications:  None Significant Maternal Lab Results:  None Other Comments:  None  Review of Systems  Constitutional: Negative.   All other systems reviewed and are negative.   Dilation: 10 Effacement (%): 100 Station: +2 Exam by:: soliz rn  Blood pressure 112/70, pulse 77, resp. rate 18, height 5' 2.5" (1.588 m), weight 63.05 kg (139 lb). Maternal Exam:  Uterine Assessment: Contraction strength is moderate.  Contraction frequency is regular.   Abdomen: Patient reports no abdominal tenderness. Fetal presentation: vertex  Introitus: Normal vulva. Normal vagina.  Ferning test: negative.  Nitrazine test: negative. Amniotic fluid character: not assessed.  Cervix: Cervix evaluated by digital exam.     Physical Exam  Vitals reviewed. Constitutional: She appears well-developed and well-nourished.  Cardiovascular: Normal rate.   Respiratory: Effort normal and breath sounds normal.  GI: Soft. Bowel sounds are normal.  Genitourinary: Vagina normal and uterus normal.    Prenatal labs: ABO, Rh: --/--/B NEG (11/29 0940) Antibody: POS (11/29 0940) Rubella: Immune (05/11 0000) RPR: Nonreactive (05/11 0000)  HBsAg: Negative (05/11 0000)  HIV: Non-reactive (05/11 0000)  GBS: Negative (11/02 0000)   Assessment/Plan: Active labor Admit  Epidural   Reiana Poteet J 08/08/2015, 1:04 PM

## 2015-08-08 NOTE — Anesthesia Preprocedure Evaluation (Addendum)
Anesthesia Evaluation  Patient identified by MRN, date of birth, ID band Patient awake    Reviewed: Allergy & Precautions, NPO status , Patient's Chart, lab work & pertinent test results  History of Anesthesia Complications Negative for: history of anesthetic complications  Airway Mallampati: I  TM Distance: >3 FB Neck ROM: Full    Dental  (+) Teeth Intact, Dental Advisory Given   Pulmonary neg pulmonary ROS,    Pulmonary exam normal breath sounds clear to auscultation       Cardiovascular Exercise Tolerance: Good negative cardio ROS Normal cardiovascular exam Rhythm:Regular Rate:Normal     Neuro/Psych PSYCHIATRIC DISORDERS Anxiety negative neurological ROS     GI/Hepatic negative GI ROS, Neg liver ROS,   Endo/Other  negative endocrine ROS  Renal/GU negative Renal ROS     Musculoskeletal negative musculoskeletal ROS (+)   Abdominal   Peds  Hematology negative hematology ROS (+)   Anesthesia Other Findings Day of surgery medications reviewed with the patient.  Reproductive/Obstetrics (+) Pregnancy                            Anesthesia Physical Anesthesia Plan  ASA: II  Anesthesia Plan: Epidural   Post-op Pain Management:    Induction:   Airway Management Planned:   Additional Equipment:   Intra-op Plan:   Post-operative Plan:   Informed Consent: I have reviewed the patients History and Physical, chart, labs and discussed the procedure including the risks, benefits and alternatives for the proposed anesthesia with the patient or authorized representative who has indicated his/her understanding and acceptance.   Dental advisory given  Plan Discussed with:   Anesthesia Plan Comments: (Patient identified. Risks/Benefits/Options discussed with patient including but not limited to bleeding, infection, nerve damage, paralysis, failed block, incomplete pain control, headache,  blood pressure changes, nausea, vomiting, reactions to medication both or allergic, itching and postpartum back pain. Confirmed with bedside nurse the patient's most recent platelet count. Confirmed with patient that they are not currently taking any anticoagulation, have any bleeding history or any family history of bleeding disorders. Patient expressed understanding and wished to proceed. All questions were answered. )        Anesthesia Quick Evaluation

## 2015-08-08 NOTE — Progress Notes (Signed)
MOB was referred for history of depression/anxiety.  Referral is screened out by Clinical Social Worker because none of the following criteria appear to apply: -History of anxiety/depression during this pregnancy, or of post-partum depression. - Diagnosis of anxiety and/or depression within last 3 years - History of depression due to pregnancy loss/loss of child or -MOB's symptoms are currently being treated with medication and/or therapy.  Per chart review, history of anxiety noted in 2010. No acute concerns noted during this pregnancy.   Please contact the Clinical Social Worker if needs arise or upon MOB request.   Loleta BooksSarah Priti Consoli, MSW, LCSW [phone]

## 2015-08-09 LAB — CBC
HCT: 29.6 % — ABNORMAL LOW (ref 36.0–46.0)
Hemoglobin: 10.1 g/dL — ABNORMAL LOW (ref 12.0–15.0)
MCH: 27.9 pg (ref 26.0–34.0)
MCHC: 34.1 g/dL (ref 30.0–36.0)
MCV: 81.8 fL (ref 78.0–100.0)
Platelets: 199 10*3/uL (ref 150–400)
RBC: 3.62 MIL/uL — ABNORMAL LOW (ref 3.87–5.11)
RDW: 13 % (ref 11.5–15.5)
WBC: 10.8 10*3/uL — ABNORMAL HIGH (ref 4.0–10.5)

## 2015-08-09 LAB — RPR: RPR: NONREACTIVE

## 2015-08-09 MED ORDER — RHO D IMMUNE GLOBULIN 1500 UNIT/2ML IJ SOSY
300.0000 ug | PREFILLED_SYRINGE | Freq: Once | INTRAMUSCULAR | Status: AC
  Administered 2015-08-09: 300 ug via INTRAVENOUS
  Filled 2015-08-09: qty 2

## 2015-08-09 MED ORDER — IBUPROFEN 600 MG PO TABS: 600.0000 mg | ORAL_TABLET | Freq: Four times a day (QID) | ORAL | Status: DC

## 2015-08-09 NOTE — Progress Notes (Signed)
PPD #1- SVD  Subjective:   Reports feeling well, desires early discharge Tolerating po/ No nausea or vomiting Bleeding is light Pain controlled with Motrin Up ad lib / ambulatory / voiding without problems Newborn: breast and formula feeding  / Circumcision: planning  Objective:   VS: VS:  Filed Vitals:   08/08/15 1415 08/08/15 1458 08/08/15 1842 08/09/15 0247  BP: 117/77 108/68 117/73 111/67  Pulse: 78 70 68 64  Temp: 98.7 F (37.1 C) 98.3 F (36.8 C) 98.3 F (36.8 C) 97.8 F (36.6 C)  TempSrc: Axillary Axillary Axillary Oral  Resp: 18 18 19 18   Height:      Weight:      SpO2:  98%  100%    LABS:  Recent Labs  08/08/15 0940 08/09/15 0620  WBC 11.4* 10.8*  HGB 14.1 10.1*  PLT 262 199   Blood type: --/--/B NEG (11/30 0620) Rubella: Immune (05/11 0000)  Tdap current/ declines Flu              I&O: Intake/Output      11/29 0701 - 11/30 0700 11/30 0701 - 12/01 0700   Urine (mL/kg/hr) 800    Blood 100    Total Output 900     Net -900          Urine Occurrence 1 x      Physical Exam: Alert and oriented X3 Abdomen: soft, non-tender, non-distended  Fundus: firm, non-tender, U-2 Perineum: Well approximated, no significant erythema, edema, or drainage; healing well. Lochia: small Extremities: no edema, no calf pain or tenderness   Assessment: PPD #1 G4P1122/ S/P:spontaneous vaginal, 2nd degree laceration Rh negative-baby Rh neg Doing well - stable for discharge home  Plan: Rhogam Discharge home RX's:  Ibuprofen 600mg  po Q 6 hrs prn pain #30 Refill x 0 Follow up in 6 wks at [facility] for postpartum visit [facility] booklet given    Donette LarryBHAMBRI, Motty Borin, N MSN, CNM 08/09/2015, 9:54 AM

## 2015-08-09 NOTE — Lactation Note (Signed)
This note was copied from the chart of Rebecca Alejandro MullingJennifer Lyford. Lactation Consultation Note  Patient Name: Rebecca Ware ZOXWR'UToday's Date: 08/09/2015 Reason for consult: Follow-up assessment;Other (Comment) (pumping and bottle feeding , see LC note )  Baby is 24 hours old and receiving EBM and formula per moms choice.  Per mom  Has been pumping with #24 Flange ( which is comfortable) . Most volume she has got'en is 30 ml . Baby took 15 ml at 1150am  And saving the rest for the next feeding.  Mom has her own DEBP Medela and has been using it .  Sore nipple and engorgement prevention and tx reviewed . Per mom familiar with tx if needed.  Mother informed of post-discharge support and given phone number to the lactation department, including services for phone call assistance;  out-patient appointments; and breastfeeding support group. List of other breastfeeding resources in the community given in the handout. Encouraged  mother to call for problems or concerns related to breastfeeding.   Maternal Data    Feeding    LATCH Score/Interventions                      Lactation Tools Discussed/Used     Consult Status Consult Status: Complete Date: 08/09/15    Kathrin Greathouseorio, Clair Bardwell Ann 08/09/2015, 12:30 PM

## 2015-08-09 NOTE — Anesthesia Postprocedure Evaluation (Signed)
Anesthesia Post Note  Patient: Rebecca MullingJennifer Ware  Procedure(s) Performed: * No procedures listed *  Patient location during evaluation: Mother Baby Anesthesia Type: Epidural Level of consciousness: awake Pain management: satisfactory to patient Vital Signs Assessment: post-procedure vital signs reviewed and stable Respiratory status: spontaneous breathing Cardiovascular status: stable Postop Assessment: no headache and no backache Anesthetic complications: no    Last Vitals:  Filed Vitals:   08/08/15 1842 08/09/15 0247  BP: 117/73 111/67  Pulse: 68 64  Temp: 36.8 C 36.6 C  Resp: 19 18    Last Pain:  Filed Vitals:   08/09/15 0535  PainSc: 5                  Evora Schechter

## 2015-08-09 NOTE — Discharge Summary (Signed)
Obstetric Discharge Summary Reason for Admission: onset of labor and 38.[redacted] weeks gestation Prenatal Course: AMA, Rh negative Intrapartum Procedures: spontaneous vaginal delivery and epidural, AROM-light MSF Postpartum Procedures: Rho(D) Ig Complications-Operative and Postpartum: 2nd degree perineal laceration HEMOGLOBIN  Date Value Ref Range Status  08/09/2015 10.1* 12.0 - 15.0 g/dL Final    Comment:    REPEATED TO VERIFY DELTA CHECK NOTED    HCT  Date Value Ref Range Status  08/09/2015 29.6* 36.0 - 46.0 % Final    Physical Exam:  General: alert, cooperative and no distress Lochia: appropriate Uterine Fundus: firm Incision: healing well, no significant drainage, no dehiscence, no significant erythema DVT Evaluation: No evidence of DVT seen on physical exam. Negative Homan's sign. No cords or calf tenderness. No significant calf/ankle edema.  Discharge Diagnoses: Term Pregnancy-delivered  Discharge Information: Date: 08/09/2015 Activity: pelvic rest Diet: routine Medications: PNV and Ibuprofen Condition: stable Instructions: refer to practice specific booklet Discharge to: home Follow-up Information    Follow up with Lenoard AdenAAVON,RICHARD J, MD. Schedule an appointment as soon as possible for a visit in 6 weeks.   Specialty:  Obstetrics and Gynecology   Contact information:   [address] Severe[address] [place] [id number] [phone]       Newborn Data: Live born female on 08/08/15 Birth Weight: 7 lb 0.9 oz (3200 g) APGAR: 9, 9  Home with mother.  Kishawn Pickar, N 08/09/2015, 9:56 AM

## 2015-08-10 LAB — RH IG WORKUP (INCLUDES ABO/RH)
ABO/RH(D): B NEG
Fetal Screen: NEGATIVE
GESTATIONAL AGE(WKS): 38
UNIT DIVISION: 0

## 2015-08-12 LAB — TYPE AND SCREEN
ABO/RH(D): B NEG
Antibody Screen: POSITIVE
DAT, IgG: NEGATIVE
UNIT DIVISION: 0
UNIT DIVISION: 0

## 2015-11-28 ENCOUNTER — Encounter: Payer: Self-pay | Admitting: Sports Medicine

## 2015-11-28 ENCOUNTER — Ambulatory Visit (INDEPENDENT_AMBULATORY_CARE_PROVIDER_SITE_OTHER): Payer: BLUE CROSS/BLUE SHIELD | Admitting: Sports Medicine

## 2015-11-28 VITALS — BP 119/77 | HR 64 | Ht 62.5 in | Wt 107.0 lb

## 2015-11-28 DIAGNOSIS — M79605 Pain in left leg: Secondary | ICD-10-CM | POA: Diagnosis not present

## 2015-11-28 NOTE — Assessment & Plan Note (Signed)
Based on today's exam:  Feel this is primarily biomechanical I suspect she has had some arch drop in pregnancy and inc motion at left ankle Try scaphoid pad on current orthtoics - these look good Warm up exercises Icing  Grad RT training  Reck if not better

## 2015-11-28 NOTE — Progress Notes (Signed)
Patient ID: Rebecca MullingJennifer Ware, female   DOB: [birth year], 35 y.o.   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 No  Left Ankle: No visible erythema or swelling. Range of motion is full in all directions. Strength is 5/5 in all directions. Stable lateral and medial ligaments; squeeze test and kleiger test unremarkable; Talar dome nontender; No pain at base of 5th MT; No tenderness over cuboid; No tenderness over N spot or navicular prominence No tenderness on posterior aspects of lateral and medial malleolus No sign of peroneal tendon subluxations; Negative tarsal tunnel tinel's Able to walk 4 steps.  Note she does have increased laxity but this is bilateral Some pain with resisted extreme inversion  Running gait shows some increase motion at left ankle in horiz plane Excellent form No limp  MSK US Medial malleolus shows no bony changes Post tib tendon intact Slight fluid at 2 to 3 levels on PT tendon FD tendon normal

## 2015-11-28 NOTE — Patient Instructions (Signed)
You have too much motion and possibly some loss of arch height on left - prob. Post pregnancy  This is causing some swelling but no damage to posterior tibial tendon  Suggest Arch pad - test this next 2 weeks Try arnica cream/ gel a few times daily Icing after runs  Start back runs - at threshold for pain - every 3 days at a mile to that distance and see if this will steadily increase  In warm up do  Pigeon toe walk x 10 Heel raises on a step with toes turned inward Standing hip rotations  See me if not improving

## 2015-12-15 DIAGNOSIS — R21 Rash and other nonspecific skin eruption: Secondary | ICD-10-CM | POA: Diagnosis not present

## 2015-12-15 DIAGNOSIS — J351 Hypertrophy of tonsils: Secondary | ICD-10-CM | POA: Diagnosis not present

## 2015-12-18 DIAGNOSIS — L718 Other rosacea: Secondary | ICD-10-CM | POA: Diagnosis not present

## 2016-01-08 DIAGNOSIS — B009 Herpesviral infection, unspecified: Secondary | ICD-10-CM | POA: Diagnosis not present

## 2016-01-08 DIAGNOSIS — L709 Acne, unspecified: Secondary | ICD-10-CM | POA: Diagnosis not present

## 2016-01-08 DIAGNOSIS — I889 Nonspecific lymphadenitis, unspecified: Secondary | ICD-10-CM | POA: Diagnosis not present

## 2016-01-08 DIAGNOSIS — Z681 Body mass index (BMI) 19 or less, adult: Secondary | ICD-10-CM | POA: Diagnosis not present

## 2016-01-18 DIAGNOSIS — M2550 Pain in unspecified joint: Secondary | ICD-10-CM | POA: Diagnosis not present

## 2016-01-18 DIAGNOSIS — L299 Pruritus, unspecified: Secondary | ICD-10-CM | POA: Diagnosis not present

## 2016-01-18 DIAGNOSIS — R5383 Other fatigue: Secondary | ICD-10-CM | POA: Diagnosis not present

## 2016-03-26 ENCOUNTER — Encounter: Payer: Self-pay | Admitting: Sports Medicine

## 2016-03-26 ENCOUNTER — Ambulatory Visit (INDEPENDENT_AMBULATORY_CARE_PROVIDER_SITE_OTHER): Payer: BLUE CROSS/BLUE SHIELD | Admitting: Sports Medicine

## 2016-03-26 VITALS — BP 114/72 | HR 68 | Ht 62.0 in | Wt 105.0 lb

## 2016-03-26 DIAGNOSIS — M25372 Other instability, left ankle: Secondary | ICD-10-CM | POA: Diagnosis not present

## 2016-03-26 DIAGNOSIS — M25572 Pain in left ankle and joints of left foot: Secondary | ICD-10-CM | POA: Diagnosis not present

## 2016-03-26 NOTE — Assessment & Plan Note (Addendum)
Noticed intermittent pain but no sign of stress fracture  Ultrasound Normal appearance of the medial malleolus The posterior tibialis tendon is normal with motion the posterior tibial tendon tends to partially sublux over the medial malleolus This is. Directly over the area of tenderness Talar dome and other ankle structures appear normal  I suspect she has had a partial retinacular strain and have some excess motion of the ankle that causes partial subluxation and posterior tibialis tendon pain  We will do a trial of ankle compression 1 foot balance exercises Okay to run but we need to evaluate if she gets any swelling  Would expect this did lead to some improvement over the next month

## 2016-03-26 NOTE — Progress Notes (Signed)
Patient ID: Alejandro MullingJennifer Soria, female   DOB: [birth year], 35 y.o.   MRN: [id number]  Patient with some persistent left ankle pain since her last pregnancy This pain comes and goes She ran 6 miles this morning without much pain She ran a 10 mile run this past week but had a lot of pain on the last couple miles The pain is sharp it is over the lateral malleolus and extends up and down the tarsal tunnel and peroneal sheath  Review of systems No ankle swelling No other joint swelling No numbness or tingling  Pexam Muscular female in no acute distress BP 114/72 mmHg  Pulse 68  Ht 5\' 2"  (1.575 m)  Wt 105 lb (47.628 kg)  BMI 19.20 kg/m2  Breastfeeding? No  Ankle: No visible erythema or swelling. Range of motion Shows laxity particularly in inversion Strength is 5/5 in all directions. Stable lateral and medial ligaments; squeeze test and kleiger test unremarkable; Talar dome nontender; No pain at base of 5th MT; No tenderness over cuboid; No tenderness over N spot or navicular prominence No tenderness on posterior aspects of lateral and medial malleolus On left she hasperoneal tendon subluxations that are incomplete but more prominent than on the right Negative tarsal tunnel tinel's Able to walk 4 steps.  Running gait shows no limp She does toe-in slightly  Orthotics in good shape

## 2016-04-24 ENCOUNTER — Ambulatory Visit (INDEPENDENT_AMBULATORY_CARE_PROVIDER_SITE_OTHER): Payer: BLUE CROSS/BLUE SHIELD | Admitting: Sports Medicine

## 2016-04-24 ENCOUNTER — Encounter: Payer: Self-pay | Admitting: Sports Medicine

## 2016-04-24 DIAGNOSIS — M25572 Pain in left ankle and joints of left foot: Secondary | ICD-10-CM | POA: Diagnosis not present

## 2016-04-24 MED ORDER — NITROGLYCERIN 0.2 MG/HR TD PT24: MEDICATED_PATCH | TRANSDERMAL | 1 refills | Status: DC

## 2016-04-24 NOTE — Patient Instructions (Signed)

## 2016-04-24 NOTE — Assessment & Plan Note (Signed)
I think her pain comes from the subluxation not from the small amount of swelling in the pseudo-bursa Continue the compression sleeve Continue rehabilitation exercises We will try using topical nitroglycerin in case it gives her some benefit Injection is a possibility with corticosteroid but I would prefer not to inject the posterior tibialis area  Okay to continue running unless swelling increases or pain is intolerable

## 2016-04-24 NOTE — Progress Notes (Signed)
Chief complaint left ankle pain  Continues on most days and has not improved that much Ankle compression wrap does help She has run as much a 17 miles but that caused pain for 2-3 days  On last visit we saw what we thought was posterior tibial subluxation Just behind the medial malleolus is still where she has her pain Now she has a clicking and she can feel a small nodule We were suspecting a retinacular tear  Review of systems No ankle swelling Foot size did increase about one half size in pregnancy No redness No numbness or tingling  Physical examination Muscular female no acute distress BP 108/73   Pulse 69   Ht 5\' 2"  (1.575 m)   Wt 105 lb (47.6 kg)   Breastfeeding? No   BMI 19.20 kg/m   Left ankle shows increased laxity particularly to inversion No ankle swelling noted No tenderness to palpation over the malleoli There is a nodule at the intersection of the posterior tibialis and flexor digitorum tendons just behind the medial malleolus With foot motion this can be seen to pop up over the medial malleolus This is somewhat tender to palpation  Ultrasound Rather than the posterior tibialis tendon subluxation it actually appears that the flexor digitorum has subluxed over is sitting superior to the posterior tibialis tendon There is a small hypoechoic area in the sheath where the 2 tendons intersect that looks like a pseudo-ganglion The tendons themselves looked intact The flexor digitorum tendon at one level looks outside the retinaculum

## 2016-06-06 DIAGNOSIS — F411 Generalized anxiety disorder: Secondary | ICD-10-CM | POA: Diagnosis not present

## 2016-06-06 DIAGNOSIS — J029 Acute pharyngitis, unspecified: Secondary | ICD-10-CM | POA: Diagnosis not present

## 2016-06-06 DIAGNOSIS — L709 Acne, unspecified: Secondary | ICD-10-CM | POA: Diagnosis not present

## 2016-06-07 DIAGNOSIS — A4901 Methicillin susceptible Staphylococcus aureus infection, unspecified site: Secondary | ICD-10-CM | POA: Diagnosis not present

## 2016-06-07 DIAGNOSIS — J039 Acute tonsillitis, unspecified: Secondary | ICD-10-CM | POA: Diagnosis not present

## 2016-06-11 DIAGNOSIS — J039 Acute tonsillitis, unspecified: Secondary | ICD-10-CM | POA: Diagnosis not present

## 2016-06-25 DIAGNOSIS — J3501 Chronic tonsillitis: Secondary | ICD-10-CM | POA: Diagnosis not present

## 2016-06-25 DIAGNOSIS — M26621 Arthralgia of right temporomandibular joint: Secondary | ICD-10-CM | POA: Diagnosis not present

## 2016-07-19 ENCOUNTER — Other Ambulatory Visit: Payer: Self-pay | Admitting: *Deleted

## 2016-07-19 MED ORDER — NITROGLYCERIN 0.2 MG/HR TD PT24: MEDICATED_PATCH | TRANSDERMAL | 3 refills | Status: DC

## 2016-07-22 ENCOUNTER — Other Ambulatory Visit: Payer: Self-pay | Admitting: *Deleted

## 2016-07-22 MED ORDER — NITROGLYCERIN 0.2 MG/HR TD PT24: MEDICATED_PATCH | TRANSDERMAL | 3 refills | Status: DC

## 2016-09-02 DIAGNOSIS — J039 Acute tonsillitis, unspecified: Secondary | ICD-10-CM | POA: Diagnosis not present

## 2016-09-02 DIAGNOSIS — J020 Streptococcal pharyngitis: Secondary | ICD-10-CM | POA: Diagnosis not present

## 2016-09-19 DIAGNOSIS — J1089 Influenza due to other identified influenza virus with other manifestations: Secondary | ICD-10-CM | POA: Diagnosis not present

## 2016-10-29 ENCOUNTER — Ambulatory Visit (INDEPENDENT_AMBULATORY_CARE_PROVIDER_SITE_OTHER): Payer: BLUE CROSS/BLUE SHIELD | Admitting: Sports Medicine

## 2016-10-29 ENCOUNTER — Ambulatory Visit: Payer: Self-pay

## 2016-10-29 ENCOUNTER — Encounter: Payer: Self-pay | Admitting: Sports Medicine

## 2016-10-29 VITALS — BP 123/73 | HR 72 | Ht 62.0 in | Wt 105.0 lb

## 2016-10-29 DIAGNOSIS — M79672 Pain in left foot: Secondary | ICD-10-CM

## 2016-10-29 DIAGNOSIS — M722 Plantar fascial fibromatosis: Secondary | ICD-10-CM | POA: Diagnosis not present

## 2016-10-29 NOTE — Assessment & Plan Note (Signed)
Stretching protocol Icing Calf exercises  For her custom orthotics we added a 5/16 felt lift She was able to run with no limp using this  Crosstraining for several days and then she can ease back into running  Her orthotics are showing some breakdown and we will need to replace them

## 2016-10-29 NOTE — Progress Notes (Signed)
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 No   BMI 19.20 kg/m   Moderately high arch bilaterally Left leg about 3/4 cm shorter  Ankle Left No visible erythema or swelling. Range of motion is increasedin all directions. Strength is 5/5 in all directions. Positive drawer test and ligamentous laxity throughout  Talar dome nontender; No pain at base of 5th MT; No tenderness over cuboid; No tenderness over N spot or navicular prominence No tenderness on posterior aspects of lateral and medial malleolus No sign of peroneal tendon subluxations; Posterior tibialis tendon subluxes slightly on eversion Negative tarsal tunnel tinel's Able to walk 4 steps.  Tender nodule on the left heel medial aspect Slight tenderness at the attachment of the plantar fascia  Ultrasound of left foot  There is a small calcified nodule over the medial insertion of the left calcaneus Just distal to this the plantar fascia significantly swollen measuring 0.68 Comparison of the insertion of the right plantar fashion shows that it is only 0.35 There is a hypoechoic area in the plantar fascia suggestive of swelling This is seen on both the longitudinal  and transverse scans  Impression: left plantar fasciitis with significant swelling and a small calcified nodule  Ultrasound and interpretation by Sibyl ParrKarl B. Darrick PennaFields, MD

## 2016-10-31 DIAGNOSIS — J020 Streptococcal pharyngitis: Secondary | ICD-10-CM | POA: Diagnosis not present

## 2016-11-02 DIAGNOSIS — J020 Streptococcal pharyngitis: Secondary | ICD-10-CM | POA: Diagnosis not present

## 2016-11-12 DIAGNOSIS — M722 Plantar fascial fibromatosis: Secondary | ICD-10-CM | POA: Diagnosis not present

## 2016-11-12 DIAGNOSIS — M71572 Other bursitis, not elsewhere classified, left ankle and foot: Secondary | ICD-10-CM | POA: Diagnosis not present

## 2016-11-12 DIAGNOSIS — M7732 Calcaneal spur, left foot: Secondary | ICD-10-CM | POA: Diagnosis not present

## 2016-11-18 DIAGNOSIS — M79672 Pain in left foot: Secondary | ICD-10-CM | POA: Diagnosis not present

## 2016-11-18 DIAGNOSIS — M722 Plantar fascial fibromatosis: Secondary | ICD-10-CM | POA: Diagnosis not present

## 2016-11-26 DIAGNOSIS — R5381 Other malaise: Secondary | ICD-10-CM | POA: Diagnosis not present

## 2016-11-26 DIAGNOSIS — J029 Acute pharyngitis, unspecified: Secondary | ICD-10-CM | POA: Diagnosis not present

## 2016-11-27 DIAGNOSIS — J0301 Acute recurrent streptococcal tonsillitis: Secondary | ICD-10-CM | POA: Diagnosis not present

## 2016-11-28 ENCOUNTER — Ambulatory Visit (INDEPENDENT_AMBULATORY_CARE_PROVIDER_SITE_OTHER): Payer: BLUE CROSS/BLUE SHIELD | Admitting: Sports Medicine

## 2016-11-28 ENCOUNTER — Encounter: Payer: Self-pay | Admitting: Sports Medicine

## 2016-11-28 DIAGNOSIS — M722 Plantar fascial fibromatosis: Secondary | ICD-10-CM

## 2016-11-28 NOTE — Progress Notes (Signed)
SUBJECTIVE:   CC: Left heel pain  HPI Last seen in the office 2/20, when she was reporting pain that had started after a recent marathon. It was not severe on first step in the AM. She was having difficulty running, but had also been trying out a new pair of shoes with a 4 mm drop. At that time, she had a positive ankle drawer test and ligamentous laxity in her ankle. US was performed. Symptoms, exam and imaging were felt to be consistent with left plantar fasciitis with significant swelling and a small calcified nodule and patient was advised to perform calf exercises, adjust her stretch protocol and ice. She was given a new lift for her orthotics  Since her last visit, the patient cross-trained and did not run for 3 weeks. She tried the heel pad added to her orthotics but felt that it hurt. After her period of rest, the patient tried to go back to running and experienced pain after 4 miles. She went to a foot and ankle walk-in clinic because of the pain, where they obtained an XR and diagnosed stress fracture. They also gave her a corticosteroid injection in her ankle. The XR was also read by Dr. Althea CharonMcKinley, who did not feel that it was a stress fracture. She then bought a cam walker and has worn it for 4 days, with some noticeable improvement even in that timeframe.  She continues to have difficulty running and has not run in the last 2 weeks. 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 Range of motion is increasedin all directions. Strength is 5/5 in all directions. 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  Left Plantar Fasciitis with Partial Tear - Recommend heel raise strengthening exercises and daily icing - Recommend continued use of boot for at least 10-14 days total - Recommend ice massaging plantar surface to reduce nodule size and will give patient moleskin donut pad to place over the nodule today - Will plan to provide new orthotics when patient is back to running   I observed and examined the patient with the resident and agree with assessment and plan.  Note reviewed and modified by me. Enid BaasKarl Fields, MD

## 2016-11-28 NOTE — Patient Instructions (Addendum)
Thanks for visiting today for follow up on your plantar fasciitis  1) Perform calf raise exercise on one leg 2) Ice massage to plantar fascia daily 3) Continue use of the boot for another 1-2 weeks (recommend at least 10-14 days total use) then test with easy run. If run testing goes well, you may gradual progress your number of miles. 4) When out of boot, massage the bottom of your foot with a styrofoam cup to reduce nodule size 5) When out of the boot, place moleskin pad over the calcified nodule (may be 1-2 layers)  Return in 3-4 weeks for orthotics

## 2016-11-28 NOTE — Assessment & Plan Note (Signed)
Suspect the PF injury was a partial tear and that explains severity of sxs  Squeeze test/ percussion no calcaneal pain  XR reviewed and appears norm  See plan

## 2016-12-08 DIAGNOSIS — J0301 Acute recurrent streptococcal tonsillitis: Secondary | ICD-10-CM

## 2016-12-08 HISTORY — DX: Acute recurrent streptococcal tonsillitis: J03.01

## 2016-12-09 ENCOUNTER — Encounter (HOSPITAL_COMMUNITY): Payer: Self-pay

## 2016-12-09 ENCOUNTER — Emergency Department (HOSPITAL_COMMUNITY)
Admission: EM | Admit: 2016-12-09 | Discharge: 2016-12-09 | Disposition: A | Discharge status: Home / Self Care | Payer: BLUE CROSS/BLUE SHIELD | Attending: Emergency Medicine | Admitting: Emergency Medicine

## 2016-12-09 DIAGNOSIS — J039 Acute tonsillitis, unspecified: Secondary | ICD-10-CM | POA: Diagnosis not present

## 2016-12-09 DIAGNOSIS — J069 Acute upper respiratory infection, unspecified: Secondary | ICD-10-CM | POA: Diagnosis not present

## 2016-12-09 DIAGNOSIS — J029 Acute pharyngitis, unspecified: Secondary | ICD-10-CM | POA: Diagnosis present

## 2016-12-09 DIAGNOSIS — Z79899 Other long term (current) drug therapy: Secondary | ICD-10-CM | POA: Diagnosis not present

## 2016-12-09 MED ORDER — AMOXICILLIN-POT CLAVULANATE 875-125 MG PO TABS
1.0000 | ORAL_TABLET | Freq: Two times a day (BID) | ORAL | 0 refills | Status: DC
Start: 2016-12-09 — End: 2017-01-06

## 2016-12-09 MED ORDER — PENICILLIN G BENZATHINE & PROC 1200000 UNIT/2ML IM SUSP
1.2000 10*6.[IU] | Freq: Once | INTRAMUSCULAR | Status: DC
  Filled 2016-12-09: qty 2

## 2016-12-09 NOTE — ED Notes (Signed)
Patient decided not to take intramuscular injection of antibiotics and is going to take the oral antibiotics that she was already prescribed.

## 2016-12-09 NOTE — ED Provider Notes (Addendum)
[facility]-EMERGENCY DEPT Provider Note   By signing my name below, I, Earmon Phoenix, attest that this documentation has been prepared under the direction and in the presence of Lavera Guise, MD. Electronically Signed: Earmon Phoenix, ED Scribe. 12/09/16. 5:41 PM.    History   Chief Complaint Chief Complaint  Patient presents with  . Sore Throat    The history is provided by the patient and medical records. No language interpreter was used.    Rebecca Ware is a 36 y.o. female who presents to the Emergency Department complaining of a sore throat that began two days ago. She reports the pain is greater on the right side. She reports an associated "bump" on her right tonsil that appeared this morning and states the roof of her mouth feels swollen. Pt's husband reports she has had cold symptoms in the past couple weeks. Pt reports this feels different than her past strep infections. She states she has two children at home that have frequent sick contacts from school. She states she was seen at an urgent care center earlier today and had a negative strep test. She reports after leaving the [facility] she developed a bad taste in her mouth and states the "bump" was draining purulent material. Pt has Augmentin 500 mg in hand that was called in by her ENT earlier today that she has not taken any of it yet. She has been taking Ibuprofen for pain with some relief. There are no modifying factors. She denies fever, chills, nausea, vomiting, difficulty breathing or swallowing. She states she was last diagnosed with strep about 6 weeks ago and was treated with Augmentin without relief so she returned and was given a Rocephin injection and Prednisone. She reports recurrent throat infections. Her ENT is Dr. Annalee Genta at [facility].   Past Medical History:  Diagnosis Date  . Abnormal Pap smear 2011   colpo. repeat WNL  . Anemia    during pregnancy  . Anxiety 2010   took medication x 6 months  . MTHFR  mutation (HCC)   . Postpartum care following vaginal delivery (11/29) 08/08/2015  . Threatened preterm labor 33 wks 08/17/2012    Patient Active Problem List   Diagnosis Date Noted  . Plantar fasciitis, left 10/29/2016  . Normal labor 08/08/2015  . Postpartum care following vaginal delivery (11/29) 08/08/2015  . 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 ENTHESOPATHY OF HIP REGION 02/12/2010  . Pain in joint, ankle and foot 12/22/2008  . PES PLANUS 12/22/2008  . UNEQUAL LEG LENGTH 12/22/2008  . ACHILLES TENDON TEAR 12/22/2008    Past Surgical History:  Procedure Laterality Date  . COLPOSCOPY  2011  . DILATION AND EVACUATION  03/29/2011   Procedure: DILATATION AND EVACUATION (D&E);  Surgeon: Genia Del;  Location: [facility];  Service: Gynecology;  Laterality: N/A;  . DILATION AND EVACUATION  07/08/2011   Procedure: DILATATION AND EVACUATION (D&E);  Surgeon: Lenoard Aden, MD;  Location: [facility];  Service: Gynecology;  Laterality: N/A;  . EYE SURGERY  1993   age 62  . LAPAROSCOPY N/A 01/21/2013   Procedure: LAPAROSCOPY DIAGNOSTIC;  Surgeon: Emelia Loron, MD;  Location: [facility];  Service: General;  Laterality: N/A;    OB History    Gravida Para Term Preterm AB Living   SAB TAB Ectopic Multiple Live Births   2     0 2       Home Medications  Prior to Admission medications   Medication Sig Start Date End Date Taking? Authorizing Provider  Adapalene-Benzoyl Peroxide (EPIDUO) 0.1-2.5 % gel  12/05/15   Historical Provider, MD  amoxicillin-clavulanate (AUGMENTIN) 875-125 MG tablet Take 1 tablet by mouth every 12 (twelve) hours. 12/09/16   Lavera Guise, MD  citalopram (CELEXA) 10 MG tablet Take 10 mg by mouth daily. 01/18/16   Historical Provider, MD  Clindamycin-Benzoyl Per, Refr, gel APPLY TO AFFECTED AREA ON FACE AT BEDTIME 10/16/15   Historical Provider, MD  dicloxacillin (DYNAPEN) 250 MG capsule TAKE ONE CAPSULE BY MOUTH EVERY 6 HOURS  09/06/15   Historical Provider, MD  doxycycline (VIBRA-TABS) 100 MG tablet TAKE 1 TABLET TWICE A DAY WITH FOOD/WATER AS NEEDED, MAY MAKE SUN SENSITIVE 11/08/15   Historical Provider, MD  escitalopram (LEXAPRO) 5 MG tablet Take 5 mg by mouth daily. 01/08/16   Historical Provider, MD  folic acid (FOLVITE) 400 MCG tablet Take 400 mcg by mouth daily.    Historical Provider, MD  ibuprofen (ADVIL,MOTRIN) 600 MG tablet Take 1 tablet (600 mg total) by mouth every 6 (six) hours. 08/09/15   Donette Larry, CNM  metroNIDAZOLE (METROCREAM) 0.75 % cream APPLY TO AFFECTED AREA ON SKIN TWICE A DAY 12/18/15   Historical Provider, MD  mupirocin ointment (BACTROBAN) 2 % APPLY TO AFFECTED AREA INSIDE TIP OF NOSE 3 TIMES A DAY FOR 5 DAYS 10/16/15   Historical Provider, MD  nitroGLYCERIN (NITRODUR - DOSED IN MG/24 HR) 0.2 mg/hr patch Place 1/4 patch to affected area daily 07/22/16   Enid Baas, MD    Family History Family History  Problem Relation Age of Onset  . Depression Mother   . Hypertension Mother   . Hypertension Father   . Depression Brother   . Cancer Paternal Grandfather     pancreatic  . Other Neg Hx     Social History Social History  Substance Use Topics  . Smoking status: Never Smoker  . Smokeless tobacco: Never Used  . Alcohol use No     Comment: nothing since pregnancy     Allergies   Clindamycin and Doxycycline   Review of Systems Review of Systems 10/14 systems reviewed and are negative other than those stated in the HPI   Physical Exam Updated Vital Signs BP 138/88   Pulse 73   Temp 98.8 F (37.1 C) (Oral)   Resp 15   Ht  (1.575 m)   Wt 105 lb (47.6 kg)   LMP 11/23/2016 (Exact Date)   SpO2 100%   BMI 19.20 kg/m   Physical Exam Physical Exam  Nursing note and vitals reviewed. Constitutional: Well developed, well nourished, non-toxic, and in no acute distress Head: Normocephalic and atraumatic.  Mouth/Throat: Oropharynx is clear and moist. Enlarged right tonsil  without exudate. Normal tonsillar pillars. Full flexion and extension of the neck.  Neck: Normal range of motion. Neck supple. No soft tissue swelling of the neck. Cardiovascular: Normal rate and regular rhythm.   Pulmonary/Chest: Effort normal and breath sounds normal.  Abdominal: Soft. There is no tenderness. There is no rebound and no guarding.  Musculoskeletal: Normal range of motion.  Neurological: Alert, no facial droop, fluent speech, moves all extremities symmetrically Skin: Skin is warm and dry.  Psychiatric: Cooperative   ED Treatments / Results  DIAGNOSTIC STUDIES: Oxygen Saturation is 100% on RA, normal by my interpretation.   COORDINATION OF CARE: 5:37 PM- Advised pt to take the Augmentin she currently has prescribed by her ENT but pt  requests Augmentin 875 mg. Return precautions discussed. Pt verbalizes understanding and agrees to plan.  Medications - No data to display  Labs (all labs ordered are listed, but only abnormal results are displayed) Labs Reviewed  RAPID STREP SCREEN (NOT AT [facility])    EKG  EKG Interpretation None       Radiology No results found.  Procedures Procedures (including critical care time)  Medications Ordered in ED Medications - No data to display   Initial Impression / Assessment and Plan / ED Course  I have reviewed the triage vital signs and the nursing notes.  Pertinent labs & imaging results that were available during my care of the patient were reviewed by me and considered in my medical decision making (see chart for details).     History of recurrent strep infections with sore throat and drainage from the right tonsil. She is nontoxic in no acute distress with normal vital signs. She is handling her secretions, with normal range of motion of her neck, with normal voice. Breathing comfortably on room air. On exam without peritonsillar swelling or trismus. Right tonsil is enlarged, there is no active drainage or exudates. Low  suspicion for deep space soft tissue neck infection. Presentation suggestive of tonsillitis. Preferred IM PCN, which she will be treated, and she will follow up closely with her ENT doctor. Strict return and follow-up instructions reviewed. She expressed understanding of all discharge instructions and felt comfortable with the plan of care.   Final Clinical Impressions(s) / ED Diagnoses   Final diagnoses:  Tonsillitis    New Prescriptions New Prescriptions   AMOXICILLIN-CLAVULANATE (AUGMENTIN) 875-125 MG TABLET    Take 1 tablet by mouth every 12 (twelve) hours.    I personally performed the services described in this documentation, which was scribed in my presence. The recorded information has been reviewed and is accurate.      Lavera Guise, MD 12/09/16 1747    Lavera Guise, MD 12/09/16 [phone]

## 2016-12-09 NOTE — ED Triage Notes (Signed)
Pt was seen at [facility] for possible strep. She has a small abscess over the right tonsil. She reports she has had recurrent strep for several months.

## 2016-12-09 NOTE — Discharge Instructions (Signed)
Please follow-up closely with your ENT doctor. Please return without fail for worsening symptoms, including persistent fever despite antibiotics, difficulty breathing, difficulty swallowing your saliva, difficulty moving your neck, voice changes, or any other symptoms concerning to you.

## 2016-12-13 DIAGNOSIS — J028 Acute pharyngitis due to other specified organisms: Secondary | ICD-10-CM | POA: Diagnosis not present

## 2016-12-16 DIAGNOSIS — L700 Acne vulgaris: Secondary | ICD-10-CM | POA: Diagnosis not present

## 2016-12-16 DIAGNOSIS — M722 Plantar fascial fibromatosis: Secondary | ICD-10-CM | POA: Diagnosis not present

## 2016-12-16 DIAGNOSIS — L309 Dermatitis, unspecified: Secondary | ICD-10-CM | POA: Diagnosis not present

## 2016-12-16 DIAGNOSIS — L0202 Furuncle of face: Secondary | ICD-10-CM | POA: Diagnosis not present

## 2016-12-17 DIAGNOSIS — M722 Plantar fascial fibromatosis: Secondary | ICD-10-CM | POA: Diagnosis not present

## 2016-12-17 DIAGNOSIS — M79672 Pain in left foot: Secondary | ICD-10-CM | POA: Diagnosis not present

## 2016-12-18 DIAGNOSIS — M722 Plantar fascial fibromatosis: Secondary | ICD-10-CM | POA: Diagnosis not present

## 2016-12-18 DIAGNOSIS — R5383 Other fatigue: Secondary | ICD-10-CM | POA: Diagnosis not present

## 2016-12-18 DIAGNOSIS — M791 Myalgia: Secondary | ICD-10-CM | POA: Diagnosis not present

## 2016-12-18 DIAGNOSIS — J3501 Chronic tonsillitis: Secondary | ICD-10-CM | POA: Diagnosis not present

## 2016-12-20 ENCOUNTER — Ambulatory Visit: Payer: BLUE CROSS/BLUE SHIELD | Admitting: Family Medicine

## 2016-12-20 DIAGNOSIS — J3501 Chronic tonsillitis: Secondary | ICD-10-CM | POA: Diagnosis not present

## 2016-12-20 DIAGNOSIS — J0301 Acute recurrent streptococcal tonsillitis: Secondary | ICD-10-CM | POA: Diagnosis not present

## 2016-12-24 DIAGNOSIS — J3501 Chronic tonsillitis: Secondary | ICD-10-CM | POA: Diagnosis not present

## 2016-12-24 DIAGNOSIS — L989 Disorder of the skin and subcutaneous tissue, unspecified: Secondary | ICD-10-CM | POA: Diagnosis not present

## 2016-12-24 DIAGNOSIS — J0301 Acute recurrent streptococcal tonsillitis: Secondary | ICD-10-CM | POA: Diagnosis not present

## 2016-12-26 ENCOUNTER — Encounter: Payer: BLUE CROSS/BLUE SHIELD | Admitting: Sports Medicine

## 2016-12-26 DIAGNOSIS — F411 Generalized anxiety disorder: Secondary | ICD-10-CM | POA: Diagnosis not present

## 2016-12-26 DIAGNOSIS — F410 Panic disorder [episodic paroxysmal anxiety] without agoraphobia: Secondary | ICD-10-CM | POA: Diagnosis not present

## 2016-12-27 DIAGNOSIS — F419 Anxiety disorder, unspecified: Secondary | ICD-10-CM | POA: Diagnosis not present

## 2016-12-30 DIAGNOSIS — F411 Generalized anxiety disorder: Secondary | ICD-10-CM | POA: Diagnosis not present

## 2017-01-01 DIAGNOSIS — F410 Panic disorder [episodic paroxysmal anxiety] without agoraphobia: Secondary | ICD-10-CM | POA: Diagnosis not present

## 2017-01-01 DIAGNOSIS — F418 Other specified anxiety disorders: Secondary | ICD-10-CM | POA: Diagnosis not present

## 2017-01-01 DIAGNOSIS — Z22322 Carrier or suspected carrier of Methicillin resistant Staphylococcus aureus: Secondary | ICD-10-CM | POA: Diagnosis not present

## 2017-01-02 DIAGNOSIS — Z22322 Carrier or suspected carrier of Methicillin resistant Staphylococcus aureus: Secondary | ICD-10-CM | POA: Diagnosis not present

## 2017-01-06 ENCOUNTER — Other Ambulatory Visit: Payer: Self-pay | Admitting: Otolaryngology

## 2017-01-06 ENCOUNTER — Encounter (HOSPITAL_BASED_OUTPATIENT_CLINIC_OR_DEPARTMENT_OTHER): Payer: Self-pay | Admitting: *Deleted

## 2017-01-06 DIAGNOSIS — B958 Unspecified staphylococcus as the cause of diseases classified elsewhere: Secondary | ICD-10-CM

## 2017-01-06 DIAGNOSIS — F411 Generalized anxiety disorder: Secondary | ICD-10-CM | POA: Diagnosis not present

## 2017-01-06 DIAGNOSIS — L089 Local infection of the skin and subcutaneous tissue, unspecified: Secondary | ICD-10-CM

## 2017-01-06 HISTORY — DX: Local infection of the skin and subcutaneous tissue, unspecified: L08.9

## 2017-01-06 HISTORY — DX: Unspecified staphylococcus as the cause of diseases classified elsewhere: B95.8

## 2017-01-06 NOTE — Pre-Procedure Instructions (Signed)
Current staph infection of skin of face discussed with Dr. Acey Lav; pt. OK to come for surgery.

## 2017-01-08 ENCOUNTER — Encounter (HOSPITAL_BASED_OUTPATIENT_CLINIC_OR_DEPARTMENT_OTHER)
Admission: RE | Disposition: A | Discharge status: Home / Self Care | Payer: Self-pay | Source: Ambulatory Visit | Attending: Otolaryngology

## 2017-01-08 ENCOUNTER — Ambulatory Visit (HOSPITAL_BASED_OUTPATIENT_CLINIC_OR_DEPARTMENT_OTHER)
Admission: RE | Admit: 2017-01-08 | Discharge: 2017-01-08 | Disposition: A | Discharge status: Home / Self Care | Payer: BLUE CROSS/BLUE SHIELD | Source: Ambulatory Visit | Attending: Otolaryngology | Admitting: Otolaryngology

## 2017-01-08 ENCOUNTER — Encounter (HOSPITAL_BASED_OUTPATIENT_CLINIC_OR_DEPARTMENT_OTHER): Payer: Self-pay | Admitting: Anesthesiology

## 2017-01-08 ENCOUNTER — Ambulatory Visit (HOSPITAL_BASED_OUTPATIENT_CLINIC_OR_DEPARTMENT_OTHER): Payer: BLUE CROSS/BLUE SHIELD | Admitting: Anesthesiology

## 2017-01-08 DIAGNOSIS — M26609 Unspecified temporomandibular joint disorder, unspecified side: Secondary | ICD-10-CM | POA: Diagnosis not present

## 2017-01-08 DIAGNOSIS — Z818 Family history of other mental and behavioral disorders: Secondary | ICD-10-CM | POA: Diagnosis not present

## 2017-01-08 DIAGNOSIS — Z800 Family history of malignant neoplasm of digestive organs: Secondary | ICD-10-CM | POA: Diagnosis not present

## 2017-01-08 DIAGNOSIS — J0300 Acute streptococcal tonsillitis, unspecified: Secondary | ICD-10-CM | POA: Diagnosis not present

## 2017-01-08 DIAGNOSIS — Z8249 Family history of ischemic heart disease and other diseases of the circulatory system: Secondary | ICD-10-CM | POA: Diagnosis not present

## 2017-01-08 DIAGNOSIS — Z91018 Allergy to other foods: Secondary | ICD-10-CM | POA: Diagnosis not present

## 2017-01-08 DIAGNOSIS — J3501 Chronic tonsillitis: Secondary | ICD-10-CM | POA: Diagnosis not present

## 2017-01-08 DIAGNOSIS — R030 Elevated blood-pressure reading, without diagnosis of hypertension: Secondary | ICD-10-CM | POA: Diagnosis not present

## 2017-01-08 DIAGNOSIS — Z79899 Other long term (current) drug therapy: Secondary | ICD-10-CM | POA: Diagnosis not present

## 2017-01-08 DIAGNOSIS — Z91011 Allergy to milk products: Secondary | ICD-10-CM | POA: Diagnosis not present

## 2017-01-08 DIAGNOSIS — J039 Acute tonsillitis, unspecified: Secondary | ICD-10-CM | POA: Diagnosis present

## 2017-01-08 DIAGNOSIS — J0301 Acute recurrent streptococcal tonsillitis: Secondary | ICD-10-CM | POA: Diagnosis not present

## 2017-01-08 DIAGNOSIS — F419 Anxiety disorder, unspecified: Secondary | ICD-10-CM | POA: Diagnosis not present

## 2017-01-08 DIAGNOSIS — J0381 Acute recurrent tonsillitis due to other specified organisms: Secondary | ICD-10-CM | POA: Diagnosis not present

## 2017-01-08 HISTORY — DX: Arthralgia of temporomandibular joint, unspecified side: M26.629

## 2017-01-08 HISTORY — PX: TONSILLECTOMY: SHX5217

## 2017-01-08 HISTORY — DX: Panic disorder (episodic paroxysmal anxiety): F41.0

## 2017-01-08 HISTORY — DX: Unspecified staphylococcus as the cause of diseases classified elsewhere: B95.8

## 2017-01-08 HISTORY — DX: Acute recurrent streptococcal tonsillitis: J03.01

## 2017-01-08 HISTORY — DX: Elevated blood-pressure reading, without diagnosis of hypertension: R03.0

## 2017-01-08 HISTORY — DX: Local infection of the skin and subcutaneous tissue, unspecified: L08.9

## 2017-01-08 SURGERY — TONSILLECTOMY
Anesthesia: General | Site: Mouth | Laterality: Bilateral

## 2017-01-08 MED ORDER — MORPHINE SULFATE (PF) 2 MG/ML IV SOLN: 2.0000 mg | INTRAVENOUS | Status: DC | PRN

## 2017-01-08 MED ORDER — LIDOCAINE HCL (CARDIAC) 20 MG/ML IV SOLN
INTRAVENOUS | Status: DC | PRN
  Administered 2017-01-08: 100 mg via INTRAVENOUS

## 2017-01-08 MED ORDER — ONDANSETRON HCL 4 MG/2ML IJ SOLN
4.0000 mg | Freq: Once | INTRAMUSCULAR | Status: AC | PRN
  Administered 2017-01-08: 4 mg via INTRAVENOUS

## 2017-01-08 MED ORDER — ONDANSETRON HCL 4 MG PO TABS: 4.0000 mg | ORAL_TABLET | ORAL | Status: DC | PRN

## 2017-01-08 MED ORDER — PROPOFOL 10 MG/ML IV BOLUS
INTRAVENOUS | Status: AC
  Filled 2017-01-08: qty 20

## 2017-01-08 MED ORDER — PHENOL 1.4 % MT LIQD: 1.0000 | OROMUCOSAL | Status: DC | PRN

## 2017-01-08 MED ORDER — CEFAZOLIN SODIUM-DEXTROSE 2-4 GM/100ML-% IV SOLN
2.0000 g | INTRAVENOUS | Status: AC
  Administered 2017-01-08: 2 g via INTRAVENOUS

## 2017-01-08 MED ORDER — DIPHENHYDRAMINE HCL 50 MG/ML IJ SOLN
INTRAMUSCULAR | Status: AC
  Filled 2017-01-08: qty 1

## 2017-01-08 MED ORDER — FENTANYL CITRATE (PF) 100 MCG/2ML IJ SOLN
INTRAMUSCULAR | Status: AC
  Filled 2017-01-08: qty 2

## 2017-01-08 MED ORDER — DEXAMETHASONE SODIUM PHOSPHATE 10 MG/ML IJ SOLN
10.0000 mg | Freq: Three times a day (TID) | INTRAMUSCULAR | Status: AC
  Administered 2017-01-08: 10 mg via INTRAVENOUS

## 2017-01-08 MED ORDER — ROCURONIUM BROMIDE 100 MG/10ML IV SOLN
INTRAVENOUS | Status: DC | PRN
  Administered 2017-01-08: 30 mg via INTRAVENOUS

## 2017-01-08 MED ORDER — BACITRACIN 500 UNIT/GM EX OINT
TOPICAL_OINTMENT | CUTANEOUS | Status: DC | PRN
  Administered 2017-01-08: 1 via TOPICAL

## 2017-01-08 MED ORDER — MIDAZOLAM HCL 2 MG/2ML IJ SOLN
1.0000 mg | INTRAMUSCULAR | Status: DC | PRN
  Administered 2017-01-08: 2 mg via INTRAVENOUS

## 2017-01-08 MED ORDER — DEXAMETHASONE SODIUM PHOSPHATE 10 MG/ML IJ SOLN
INTRAMUSCULAR | Status: AC
  Filled 2017-01-08: qty 1

## 2017-01-08 MED ORDER — CEFAZOLIN SODIUM-DEXTROSE 2-4 GM/100ML-% IV SOLN
INTRAVENOUS | Status: AC
  Filled 2017-01-08: qty 100

## 2017-01-08 MED ORDER — PROPOFOL 10 MG/ML IV BOLUS
INTRAVENOUS | Status: DC | PRN
  Administered 2017-01-08: 100 mg via INTRAVENOUS
  Administered 2017-01-08: 50 mg via INTRAVENOUS

## 2017-01-08 MED ORDER — CHLORHEXIDINE GLUCONATE CLOTH 2 % EX PADS: 6.0000 | MEDICATED_PAD | Freq: Once | CUTANEOUS | Status: DC

## 2017-01-08 MED ORDER — ONDANSETRON HCL 4 MG/2ML IJ SOLN
INTRAMUSCULAR | Status: AC
  Filled 2017-01-08: qty 2

## 2017-01-08 MED ORDER — SULFAMETHOXAZOLE-TRIMETHOPRIM 800-160 MG PO TABS
1.0000 | ORAL_TABLET | Freq: Two times a day (BID) | ORAL | Status: DC
  Administered 2017-01-08: 1 via ORAL

## 2017-01-08 MED ORDER — DEXTROSE-NACL 5-0.9 % IV SOLN
INTRAVENOUS | Status: DC
  Administered 2017-01-08: 11:00:00 via INTRAVENOUS

## 2017-01-08 MED ORDER — FENTANYL CITRATE (PF) 100 MCG/2ML IJ SOLN
50.0000 ug | INTRAMUSCULAR | Status: DC | PRN
  Administered 2017-01-08: 100 ug via INTRAVENOUS

## 2017-01-08 MED ORDER — DIPHENHYDRAMINE HCL 50 MG/ML IJ SOLN
12.5000 mg | Freq: Once | INTRAMUSCULAR | Status: AC
  Administered 2017-01-08: 12.5 mg via INTRAVENOUS

## 2017-01-08 MED ORDER — SCOPOLAMINE 1 MG/3DAYS TD PT72: 1.0000 | MEDICATED_PATCH | Freq: Once | TRANSDERMAL | Status: DC | PRN

## 2017-01-08 MED ORDER — HYDROMORPHONE HCL 1 MG/ML IJ SOLN
INTRAMUSCULAR | Status: AC
  Filled 2017-01-08: qty 1

## 2017-01-08 MED ORDER — DEXAMETHASONE SODIUM PHOSPHATE 4 MG/ML IJ SOLN
INTRAMUSCULAR | Status: DC | PRN
Start: 2017-01-08 — End: 2017-01-08
  Administered 2017-01-08: 10 mg via INTRAVENOUS

## 2017-01-08 MED ORDER — MUPIROCIN 2 % EX OINT
1.0000 "application " | TOPICAL_OINTMENT | Freq: Three times a day (TID) | CUTANEOUS | Status: DC
  Administered 2017-01-08 (×2): 1 via TOPICAL

## 2017-01-08 MED ORDER — HYDROCODONE-ACETAMINOPHEN 7.5-325 MG/15ML PO SOLN
10.0000 mL | ORAL | Status: DC | PRN
  Administered 2017-01-08: 15 mL via ORAL
  Filled 2017-01-08: qty 15

## 2017-01-08 MED ORDER — DEXAMETHASONE SODIUM PHOSPHATE 10 MG/ML IJ SOLN
10.0000 mg | Freq: Once | INTRAMUSCULAR | Status: AC
  Administered 2017-01-08: 10 mg via INTRAVENOUS

## 2017-01-08 MED ORDER — MIDAZOLAM HCL 2 MG/2ML IJ SOLN
INTRAMUSCULAR | Status: AC
  Filled 2017-01-08: qty 2

## 2017-01-08 MED ORDER — ONDANSETRON HCL 4 MG/2ML IJ SOLN: 4.0000 mg | INTRAMUSCULAR | Status: DC | PRN

## 2017-01-08 MED ORDER — MEPERIDINE HCL 25 MG/ML IJ SOLN: 6.2500 mg | INTRAMUSCULAR | Status: DC | PRN

## 2017-01-08 MED ORDER — HYDROCODONE-ACETAMINOPHEN 7.5-325 MG/15ML PO SOLN
10.0000 mL | Freq: Four times a day (QID) | ORAL | 0 refills | Status: AC | PRN
Start: 2017-01-08 — End: ?

## 2017-01-08 MED ORDER — IBUPROFEN 100 MG/5ML PO SUSP: 400.0000 mg | Freq: Four times a day (QID) | ORAL | Status: DC | PRN

## 2017-01-08 MED ORDER — SUGAMMADEX SODIUM 200 MG/2ML IV SOLN
INTRAVENOUS | Status: DC | PRN
  Administered 2017-01-08: 200 mg via INTRAVENOUS

## 2017-01-08 MED ORDER — ONDANSETRON HCL 4 MG/2ML IJ SOLN
INTRAMUSCULAR | Status: DC | PRN
  Administered 2017-01-08: 4 mg via INTRAVENOUS

## 2017-01-08 MED ORDER — HYDROMORPHONE HCL 1 MG/ML IJ SOLN
0.2500 mg | INTRAMUSCULAR | Status: DC | PRN
  Administered 2017-01-08 (×2): 0.25 mg via INTRAVENOUS

## 2017-01-08 MED ORDER — FENTANYL CITRATE (PF) 100 MCG/2ML IJ SOLN
INTRAMUSCULAR | Status: AC
Start: 2017-01-08 — End: 2017-01-08
  Filled 2017-01-08: qty 2

## 2017-01-08 MED ORDER — LIDOCAINE 2% (20 MG/ML) 5 ML SYRINGE
INTRAMUSCULAR | Status: AC
  Filled 2017-01-08: qty 5

## 2017-01-08 MED ORDER — LACTATED RINGERS IV SOLN
INTRAVENOUS | Status: DC
Start: 2017-01-08 — End: 2017-01-08
  Administered 2017-01-08: 08:00:00 via INTRAVENOUS

## 2017-01-08 MED ORDER — SERTRALINE HCL 50 MG PO TABS: 50.0000 mg | ORAL_TABLET | Freq: Every day | ORAL | Status: DC

## 2017-01-08 SURGICAL SUPPLY — 32 items
CANISTER SUCT 1200ML W/VALVE (MISCELLANEOUS) ×4 IMPLANT
CATH ROBINSON RED A/P 10FR (CATHETERS) ×3 IMPLANT
COAGULATOR SUCT 6 FR SWTCH (ELECTROSURGICAL) ×1
COAGULATOR SUCT SWTCH 10FR 6 (ELECTROSURGICAL) ×3 IMPLANT
COVER MAYO STAND STRL (DRAPES) ×4 IMPLANT
ELECT COATED BLADE 2.86 ST (ELECTRODE) ×4 IMPLANT
ELECT REM PT RETURN 9FT ADLT (ELECTROSURGICAL) ×4
ELECT REM PT RETURN 9FT PED (ELECTROSURGICAL)
ELECTRODE REM PT RETRN 9FT PED (ELECTROSURGICAL) IMPLANT
ELECTRODE REM PT RTRN 9FT ADLT (ELECTROSURGICAL) ×1 IMPLANT
GAUZE SPONGE 4X4 12PLY STRL LF (GAUZE/BANDAGES/DRESSINGS) ×4 IMPLANT
GLOVE BIOGEL M 7.0 STRL (GLOVE) ×4 IMPLANT
GLOVE BIOGEL PI IND STRL 7.0 (GLOVE) ×1 IMPLANT
GLOVE BIOGEL PI INDICATOR 7.0 (GLOVE) ×2
GLOVE ECLIPSE 6.5 STRL STRAW (GLOVE) ×3 IMPLANT
GOWN STRL REUS W/ TWL LRG LVL3 (GOWN DISPOSABLE) ×4 IMPLANT
GOWN STRL REUS W/TWL LRG LVL3 (GOWN DISPOSABLE) ×8
MARKER SKIN DUAL TIP RULER LAB (MISCELLANEOUS) IMPLANT
NS IRRIG 1000ML POUR BTL (IV SOLUTION) ×4 IMPLANT
PENCIL BUTTON HOLSTER BLD 10FT (ELECTRODE) ×4 IMPLANT
PIN SAFETY STERILE (MISCELLANEOUS) ×3 IMPLANT
SHEET MEDIUM DRAPE 40X70 STRL (DRAPES) ×4 IMPLANT
SOLUTION BUTLER CLEAR DIP (MISCELLANEOUS) ×3 IMPLANT
SPONGE TONSIL 1 RF SGL (DISPOSABLE) IMPLANT
SPONGE TONSIL 1.25 RF SGL STRG (GAUZE/BANDAGES/DRESSINGS) ×3 IMPLANT
SYR BULB 3OZ (MISCELLANEOUS) ×4 IMPLANT
TOWEL OR 17X24 6PK STRL BLUE (TOWEL DISPOSABLE) ×4 IMPLANT
TUBE CONNECTING 20'X1/4 (TUBING) ×1
TUBE CONNECTING 20X1/4 (TUBING) ×3 IMPLANT
TUBE SALEM SUMP 12R W/ARV (TUBING) IMPLANT
TUBE SALEM SUMP 16 FR W/ARV (TUBING) ×3 IMPLANT
YANKAUER SUCT BULB TIP NO VENT (SUCTIONS) ×4 IMPLANT

## 2017-01-08 NOTE — H&P (Signed)
Rebecca Ware is an 36 y.o. female.   Chief Complaint: Recurrent tonsillitis HPI: hx of  Recurrent tonsillitis with cryptic changes.  Past Medical History:  Diagnosis Date  . 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 Elevated blood pressure reading    x 6 mos. - no current med.  . Panic attacks   . Staph skin infection 01/06/2017   face - started antibiotic 2 weeks ago  . TMJ syndrome     Past Surgical History:  Procedure Laterality Date  . COLPOSCOPY  2011  . DILATION AND EVACUATION  03/29/2011   Procedure: DILATATION AND EVACUATION (D&E);  Surgeon: Genia Del;  Location: [facility];  Service: Gynecology;  Laterality: N/A;  . DILATION AND EVACUATION  07/08/2011   Procedure: DILATATION AND EVACUATION (D&E);  Surgeon: Lenoard Aden, MD;  Location: [facility];  Service: Gynecology;  Laterality: N/A;  . LAPAROSCOPY N/A 01/21/2013   Procedure: LAPAROSCOPY DIAGNOSTIC;  Surgeon: Emelia Loron, MD;  Location: [facility];  Service: General;  Laterality: N/A;  . STRABISMUS SURGERY Bilateral 1993    Family History  Problem Relation Age of Onset  . Depression Mother   . Hypertension Mother   . Hypertension Father   . Depression Brother   . Cancer Paternal Grandfather     pancreatic  . Other Neg Hx    Social History:  reports that she has never smoked. She has never used smokeless tobacco. She reports that she drinks alcohol. She reports that she does not use drugs.  Allergies:  Allergies  Allergen Reactions  . Beef-Derived Products Nausea And Vomiting  . Milk-Related Compounds Other (See Comments)    GI UPSET    Medications Prior to Admission  Medication Sig Dispense Refill  . mupirocin ointment (BACTROBAN) 2 % Apply 1 application topically 3 (three) times daily. FACE    . sertraline (ZOLOFT) 50 MG tablet Take 50 mg by mouth daily.    Marland Kitchen sulfamethoxazole-trimethoprim (BACTRIM DS,SEPTRA DS) 800-160 MG tablet Take 1 tablet by mouth 2 (two) times daily.      No  results found for this or any previous visit (from the past 48 hour(s)). No results found.  Review of Systems  Constitutional: Negative.   HENT: Positive for sore throat.   Respiratory: Negative.   Cardiovascular: Negative.     Blood pressure (!) 153/91, pulse (!) 107, temperature 98.3 F (36.8 C), temperature source Oral, resp. rate 18, height  (1.575 m), weight 47.2 kg (104 lb), last menstrual period 12/17/2016, SpO2 100 %, unknown if currently breastfeeding. Physical Exam  Constitutional: She appears well-developed.  HENT:  2+ cryptic tonsils  Neck: Normal range of motion. Neck supple.  Cardiovascular: Normal rate.   Respiratory: Effort normal.     Assessment/Plan Adm for OP tonsillectomy.  Witney Huie, MD 01/08/2017, 8:35 AM

## 2017-01-08 NOTE — Op Note (Signed)
Operative Note: Tonsillectomy  Patient: Rebecca Ware  Medical record number: [id number]  Date:01/08/2017  Pre-operative Indications:Recurrent tonsillitis  Postoperative Indications: Same  Surgical Procedure: Tonsillectomy  Anesthesia: GET  Surgeon: Barbee Cough, M.D.  Complications: None  EBL: Minimal   Brief History: The patient is a 36 y.o. female with a history of recurrent acute tonsillitis and tonsillar hypertrophy. The patient has been on multiple courses of antibiotics for recurrent infection. Based on the patient's history and findings I recommended tonsillectomy under general anesthesia, risks and benefits were discussed in detail with the patient and family. They understand and agree with our plan for surgery which is scheduled on elective basis at [facility].  Surgical Procedure: The patient is brought to the operating room on 01/08/2017 and placed in supine position on the operating table. General endotracheal anesthesia was established without difficulty. When the patient was adequately anesthetized, surgical timeout was performed and correct identification of the patient and the surgical procedure. The patient was positioned and prepped and draped in sterile fashion.  The patient prepared for surgery a Lisabeth Register mouth gag was inserted without difficulty there were no loose or broken teeth and the hard soft palate were intact. Tonsillectomy was performed, using Bovie cautery and dissecting a subcapsular fashion the entire left tonsil was removed from superior pole to tongue base. Right tonsil removed in a similar fashion. Tonsillar tissue was sent to pathology for gross and microscopic analysis. The tonsillar fossa were gently abraded with dry tonsil sponge and several small areas of point hemorrhage were cauterized with suction cautery. The Crowe-Davis mouth gag was released and reapplied there is no active bleeding. Oral cavity and nasopharynx were irrigated with saline. An  orogastric tube was passed and stomach contents were aspirated. Mouthgag was removed again no loose or broken teeth and no bleeding. Patient was awakened from anesthetic and transferred from the operating room to the recovery room in stable condition. There were no complications and blood loss was minimal.   Barbee Cough, M.D. [facility] 01/08/2017

## 2017-01-08 NOTE — Discharge Instructions (Signed)
Tonsillectomy, Adult, Care After This sheet gives you information about how to care for yourself after your procedure. Your doctor may also give you more specific instructions. If you have problems or questions, contact your doctor. Follow these instructions at home: Eating and drinking   Follow instructions from your doctor about eating and drinking.  For many days after surgery, choose foods that are soft and cold. Examples are:  Gelatin.  Sherbet.  Ice cream.  Frozen ice pops.  If you have an upset stomach (are nauseous), choose liquids that are cold and that you can see through (clear liquids). Examples are water and apple juice without pulp. You can try thick liquids and soft foods when you can eat without throwing up (vomiting) and without too much pain. Examples are:  Creamed soups.  Soft, warm cereals, such as oatmeal or hot wheat cereal.  Milk.  Mashed potatoes.  Applesauce.  Drink enough fluid to keep your pee (urine) clear or pale yellow. Driving   Do not drive for 24 hours if you were given a medicine to help you relax (sedative).  Do not drive or use heavy machinery while taking prescription pain medicine or until your health care provider approves. General instructions   Rest.  Keep your head raised (elevated) when lying down.  Take medicines only as told by your doctor. These include over-the-counter medicines and prescription medicines.  Do not use mouthwashes until your doctor says it is okay.  Gargle only as told by your doctor.  Stay away from people who are sick. Contact a doctor if:  Your pain gets worse or medicines do not help.  You have a fever.  You have a rash.  You feel light-headed or you pass out (faint).  You cannot swallow even a little liquid or spit (saliva).  Your pee is very dark. Get help right away if:  You have trouble breathing.  You bleed bright red blood from your throat.  You throw up bright red  blood. Summary  Follow instructions from your doctor about what you cannot eat or drink. For many days after surgery, choose foods that are soft and cold.  Talk with your doctor about how to keep your pain under control. This can help you rest and swallow better.  Get help right away if you bleed bright red blood from your throat or you throw up bright red blood.   Post Anesthesia Home Care Instructions  Activity: Get plenty of rest for the remainder of the day. A responsible individual must stay with you for 24 hours following the procedure.  For the next 24 hours, DO NOT: -Drive a car -Advertising copywriter -Drink alcoholic beverages -Take any medication unless instructed by your physician -Make any legal decisions or sign important papers.  Meals: Start with liquid foods such as gelatin or soup. Progress to regular foods as tolerated. Avoid greasy, spicy, heavy foods. If nausea and/or vomiting occur, drink only clear liquids until the nausea and/or vomiting subsides. Call your physician if vomiting continues.  Special Instructions/Symptoms: Your throat may feel dry or sore from the anesthesia or the breathing tube placed in your throat during surgery. If this causes discomfort, gargle with warm salt water. The discomfort should disappear within 24 hours.  If you had a scopolamine patch placed behind your ear for the management of post- operative nausea and/or vomiting:  1. The medication in the patch is effective for 72 hours, after which it should be removed.  Wrap patch in a  tissue and discard in the trash. Wash hands thoroughly with soap and water. 2. You may remove the patch earlier than 72 hours if you experience unpleasant side effects which may include dry mouth, dizziness or visual disturbances. 3. Avoid touching the patch. Wash your hands with soap and water after contact with the patch.

## 2017-01-08 NOTE — Anesthesia Procedure Notes (Signed)
Procedure Name: Intubation Date/Time: 01/08/2017 8:46 AM Performed by: Maryella Shivers Pre-anesthesia Checklist: Patient identified, Emergency Drugs available, Suction available and Patient being monitored Patient Re-evaluated:Patient Re-evaluated prior to inductionOxygen Delivery Method: Circle system utilized Preoxygenation: Pre-oxygenation with 100% oxygen Intubation Type: IV induction Ventilation: Mask ventilation without difficulty Laryngoscope Size: Mac and 3 Tube type: Oral Tube size: 7.0 mm Number of attempts: 1 Airway Equipment and Method: Stylet and Oral airway Placement Confirmation: ETT inserted through vocal cords under direct vision,  positive ETCO2 and breath sounds checked- equal and bilateral Secured at: 18 cm Tube secured with: Tape Dental Injury: Teeth and Oropharynx as per pre-operative assessment

## 2017-01-08 NOTE — Anesthesia Preprocedure Evaluation (Signed)
Anesthesia Evaluation  Patient identified by MRN, date of birth, ID band Patient awake    Reviewed: Allergy & Precautions, NPO status , Patient's Chart, lab work & pertinent test results  Airway Mallampati: I  TM Distance: >3 FB Neck ROM: Full    Dental   Pulmonary    Pulmonary exam normal        Cardiovascular Normal cardiovascular exam     Neuro/Psych Anxiety    GI/Hepatic   Endo/Other    Renal/GU      Musculoskeletal   Abdominal   Peds  Hematology   Anesthesia Other Findings   Reproductive/Obstetrics                             Anesthesia Physical Anesthesia Plan  ASA: II  Anesthesia Plan: General   Post-op Pain Management:    Induction: Intravenous  Airway Management Planned: Oral ETT  Additional Equipment:   Intra-op Plan:   Post-operative Plan: Extubation in OR  Informed Consent: I have reviewed the patients History and Physical, chart, labs and discussed the procedure including the risks, benefits and alternatives for the proposed anesthesia with the patient or authorized representative who has indicated his/her understanding and acceptance.     Plan Discussed with: CRNA and Surgeon  Anesthesia Plan Comments:         Anesthesia Quick Evaluation

## 2017-01-08 NOTE — Transfer of Care (Signed)
Immediate Anesthesia Transfer of Care Note  Patient: Rebecca Ware  Procedure(s) Performed: Procedure(s): TONSILLECTOMY (Bilateral)  Patient Location: PACU  Anesthesia Type:General  Level of Consciousness: awake, alert  and oriented  Airway & Oxygen Therapy: Patient Spontanous Breathing and Patient connected to face mask oxygen  Post-op Assessment: Report given to RN and Post -op Vital signs reviewed and stable  Post vital signs: Reviewed and stable  Last Vitals:  Vitals:   01/08/17 0729  BP: (!) 153/91  Pulse: (!) 107  Resp: 18  Temp: 36.8 C    Last Pain:  Vitals:   01/08/17 0729  TempSrc: Oral         Complications: No apparent anesthesia complications

## 2017-01-08 NOTE — Anesthesia Postprocedure Evaluation (Signed)
Anesthesia Post Note  Patient: Deloma Spindle  Procedure(s) Performed: Procedure(s) (LRB): TONSILLECTOMY (Bilateral)  Patient location during evaluation: PACU Anesthesia Type: General Level of consciousness: awake and alert Pain management: pain level controlled Vital Signs Assessment: post-procedure vital signs reviewed and stable Respiratory status: spontaneous breathing, nonlabored ventilation, respiratory function stable and patient connected to nasal cannula oxygen Cardiovascular status: blood pressure returned to baseline and stable Postop Assessment: no signs of nausea or vomiting Anesthetic complications: no       Last Vitals:  Vitals:   01/08/17 1015 01/08/17 1028  BP: 119/77 119/76  Pulse: 72 78  Resp: 12   Temp:  36.9 C    Last Pain:  Vitals:   01/08/17 1028  TempSrc:   PainSc: 3                  Hopie Pellegrin DAVID

## 2017-01-10 ENCOUNTER — Encounter (HOSPITAL_BASED_OUTPATIENT_CLINIC_OR_DEPARTMENT_OTHER): Payer: Self-pay | Admitting: Otolaryngology

## 2017-01-21 DIAGNOSIS — M79672 Pain in left foot: Secondary | ICD-10-CM | POA: Diagnosis not present

## 2017-01-23 DIAGNOSIS — B9689 Other specified bacterial agents as the cause of diseases classified elsewhere: Secondary | ICD-10-CM | POA: Diagnosis not present

## 2017-01-23 DIAGNOSIS — L700 Acne vulgaris: Secondary | ICD-10-CM | POA: Diagnosis not present

## 2017-01-23 DIAGNOSIS — L0202 Furuncle of face: Secondary | ICD-10-CM | POA: Diagnosis not present

## 2017-01-23 DIAGNOSIS — M722 Plantar fascial fibromatosis: Secondary | ICD-10-CM | POA: Diagnosis not present

## 2017-02-04 DIAGNOSIS — F418 Other specified anxiety disorders: Secondary | ICD-10-CM | POA: Diagnosis not present

## 2017-02-04 DIAGNOSIS — F410 Panic disorder [episodic paroxysmal anxiety] without agoraphobia: Secondary | ICD-10-CM | POA: Diagnosis not present

## 2017-02-04 DIAGNOSIS — F411 Generalized anxiety disorder: Secondary | ICD-10-CM | POA: Diagnosis not present

## 2017-02-05 DIAGNOSIS — M79672 Pain in left foot: Secondary | ICD-10-CM | POA: Diagnosis not present

## 2017-02-05 DIAGNOSIS — M722 Plantar fascial fibromatosis: Secondary | ICD-10-CM | POA: Diagnosis not present

## 2017-02-11 DIAGNOSIS — F411 Generalized anxiety disorder: Secondary | ICD-10-CM | POA: Diagnosis not present

## 2017-02-12 DIAGNOSIS — M722 Plantar fascial fibromatosis: Secondary | ICD-10-CM | POA: Diagnosis not present

## 2017-02-12 DIAGNOSIS — M79672 Pain in left foot: Secondary | ICD-10-CM | POA: Diagnosis not present

## 2017-02-19 DIAGNOSIS — M722 Plantar fascial fibromatosis: Secondary | ICD-10-CM | POA: Diagnosis not present

## 2017-02-19 DIAGNOSIS — M79672 Pain in left foot: Secondary | ICD-10-CM | POA: Diagnosis not present

## 2017-02-20 DIAGNOSIS — B9689 Other specified bacterial agents as the cause of diseases classified elsewhere: Secondary | ICD-10-CM | POA: Diagnosis not present

## 2017-02-20 DIAGNOSIS — L0202 Furuncle of face: Secondary | ICD-10-CM | POA: Diagnosis not present

## 2017-02-20 DIAGNOSIS — L700 Acne vulgaris: Secondary | ICD-10-CM | POA: Diagnosis not present

## 2017-02-26 ENCOUNTER — Ambulatory Visit: Payer: BLUE CROSS/BLUE SHIELD | Admitting: Family Medicine

## 2017-02-26 DIAGNOSIS — M79672 Pain in left foot: Secondary | ICD-10-CM | POA: Diagnosis not present

## 2017-02-26 DIAGNOSIS — M722 Plantar fascial fibromatosis: Secondary | ICD-10-CM | POA: Diagnosis not present

## 2017-03-05 DIAGNOSIS — M79672 Pain in left foot: Secondary | ICD-10-CM | POA: Diagnosis not present

## 2017-03-05 DIAGNOSIS — Z79899 Other long term (current) drug therapy: Secondary | ICD-10-CM | POA: Diagnosis not present

## 2017-03-05 DIAGNOSIS — L700 Acne vulgaris: Secondary | ICD-10-CM | POA: Diagnosis not present

## 2017-03-05 DIAGNOSIS — L739 Follicular disorder, unspecified: Secondary | ICD-10-CM | POA: Diagnosis not present

## 2017-03-05 DIAGNOSIS — L709 Acne, unspecified: Secondary | ICD-10-CM | POA: Diagnosis not present

## 2017-03-05 DIAGNOSIS — Z5181 Encounter for therapeutic drug level monitoring: Secondary | ICD-10-CM | POA: Diagnosis not present

## 2017-03-05 DIAGNOSIS — M722 Plantar fascial fibromatosis: Secondary | ICD-10-CM | POA: Diagnosis not present

## 2017-03-05 DIAGNOSIS — M2550 Pain in unspecified joint: Secondary | ICD-10-CM | POA: Diagnosis not present

## 2017-03-20 DIAGNOSIS — Z6820 Body mass index (BMI) 20.0-20.9, adult: Secondary | ICD-10-CM | POA: Diagnosis not present

## 2017-03-20 DIAGNOSIS — Z01419 Encounter for gynecological examination (general) (routine) without abnormal findings: Secondary | ICD-10-CM | POA: Diagnosis not present

## 2017-03-20 DIAGNOSIS — Z1151 Encounter for screening for human papillomavirus (HPV): Secondary | ICD-10-CM | POA: Diagnosis not present

## 2017-08-19 ENCOUNTER — Other Ambulatory Visit: Payer: Self-pay | Admitting: Sports Medicine

## 2017-09-17 DIAGNOSIS — J209 Acute bronchitis, unspecified: Secondary | ICD-10-CM | POA: Diagnosis not present

## 2017-09-17 DIAGNOSIS — R05 Cough: Secondary | ICD-10-CM | POA: Diagnosis not present

## 2017-09-17 DIAGNOSIS — R0602 Shortness of breath: Secondary | ICD-10-CM | POA: Diagnosis not present

## 2017-10-21 DIAGNOSIS — R062 Wheezing: Secondary | ICD-10-CM | POA: Diagnosis not present

## 2017-10-21 DIAGNOSIS — R05 Cough: Secondary | ICD-10-CM | POA: Diagnosis not present

## 2017-10-21 DIAGNOSIS — J069 Acute upper respiratory infection, unspecified: Secondary | ICD-10-CM | POA: Diagnosis not present

## 2017-12-19 DIAGNOSIS — H109 Unspecified conjunctivitis: Secondary | ICD-10-CM | POA: Diagnosis not present

## 2017-12-22 DIAGNOSIS — H6691 Otitis media, unspecified, right ear: Secondary | ICD-10-CM | POA: Diagnosis not present

## 2017-12-22 DIAGNOSIS — H6980 Other specified disorders of Eustachian tube, unspecified ear: Secondary | ICD-10-CM | POA: Diagnosis not present

## 2018-01-19 ENCOUNTER — Other Ambulatory Visit: Payer: Self-pay | Admitting: *Deleted

## 2018-01-19 MED ORDER — NITROGLYCERIN 0.2 MG/HR TD PT24: MEDICATED_PATCH | TRANSDERMAL | 2 refills | Status: AC

## 2018-01-21 DIAGNOSIS — M722 Plantar fascial fibromatosis: Secondary | ICD-10-CM | POA: Diagnosis not present

## 2018-01-21 DIAGNOSIS — M79672 Pain in left foot: Secondary | ICD-10-CM | POA: Diagnosis not present

## 2018-01-28 DIAGNOSIS — M79672 Pain in left foot: Secondary | ICD-10-CM | POA: Diagnosis not present

## 2018-01-28 DIAGNOSIS — M722 Plantar fascial fibromatosis: Secondary | ICD-10-CM | POA: Diagnosis not present

## 2018-02-04 DIAGNOSIS — M722 Plantar fascial fibromatosis: Secondary | ICD-10-CM | POA: Diagnosis not present

## 2018-02-04 DIAGNOSIS — M79672 Pain in left foot: Secondary | ICD-10-CM | POA: Diagnosis not present

## 2018-02-11 DIAGNOSIS — M79672 Pain in left foot: Secondary | ICD-10-CM | POA: Diagnosis not present

## 2018-02-11 DIAGNOSIS — M722 Plantar fascial fibromatosis: Secondary | ICD-10-CM | POA: Diagnosis not present

## 2018-02-13 DIAGNOSIS — J069 Acute upper respiratory infection, unspecified: Secondary | ICD-10-CM | POA: Diagnosis not present

## 2018-02-19 DIAGNOSIS — M79672 Pain in left foot: Secondary | ICD-10-CM | POA: Diagnosis not present

## 2018-02-19 DIAGNOSIS — M722 Plantar fascial fibromatosis: Secondary | ICD-10-CM | POA: Diagnosis not present

## 2018-03-30 DIAGNOSIS — R5383 Other fatigue: Secondary | ICD-10-CM | POA: Diagnosis not present

## 2018-03-30 DIAGNOSIS — Z91018 Allergy to other foods: Secondary | ICD-10-CM | POA: Diagnosis not present

## 2018-03-30 DIAGNOSIS — F418 Other specified anxiety disorders: Secondary | ICD-10-CM | POA: Diagnosis not present

## 2018-03-30 DIAGNOSIS — F419 Anxiety disorder, unspecified: Secondary | ICD-10-CM | POA: Diagnosis not present

## 2018-03-30 DIAGNOSIS — F410 Panic disorder [episodic paroxysmal anxiety] without agoraphobia: Secondary | ICD-10-CM | POA: Diagnosis not present

## 2018-06-11 ENCOUNTER — Other Ambulatory Visit: Payer: Self-pay

## 2018-06-11 ENCOUNTER — Ambulatory Visit: Payer: BLUE CROSS/BLUE SHIELD | Attending: Family Medicine | Admitting: Physical Therapy

## 2018-06-11 ENCOUNTER — Ambulatory Visit: Payer: BLUE CROSS/BLUE SHIELD | Admitting: Physical Therapy

## 2018-06-11 DIAGNOSIS — R262 Difficulty in walking, not elsewhere classified: Secondary | ICD-10-CM

## 2018-06-11 DIAGNOSIS — M25572 Pain in left ankle and joints of left foot: Secondary | ICD-10-CM | POA: Insufficient documentation

## 2018-06-11 NOTE — Patient Instructions (Signed)

## 2018-06-11 NOTE — Therapy (Signed)
[facility] [address] [place], [place], [address] Phone: [phone]   Fax:  [phone]  Physical Therapy Evaluation  Patient Details  Name: Rebecca Ware MRN: [id number] Date of Birth: [birth year] Referring Provider (PT): Eula Listen, MD   Encounter Date: 06/11/2018  PT End of Session - 06/11/18 2114    Visit Number  1    Number of Visits  10    Date for PT Re-Evaluation  07/16/18    Authorization Type  BCBS    PT Start Time  1717    PT Stop Time  1803    PT Time Calculation (min)  46 min       Past Medical History:  Diagnosis Date  . 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 Elevated blood pressure reading    x 6 mos. - no current med.  . Panic attacks   . Staph skin infection 01/06/2017   face - started antibiotic 2 weeks ago  . TMJ syndrome     Past Surgical History:  Procedure Laterality Date  . COLPOSCOPY  2011  . DILATION AND EVACUATION  03/29/2011   Procedure: DILATATION AND EVACUATION (D&E);  Surgeon: Genia Del;  Location: [facility];  Service: Gynecology;  Laterality: N/A;  . DILATION AND EVACUATION  07/08/2011   Procedure: DILATATION AND EVACUATION (D&E);  Surgeon: Lenoard Aden, MD;  Location: [facility];  Service: Gynecology;  Laterality: N/A;  . LAPAROSCOPY N/A 01/21/2013   Procedure: LAPAROSCOPY DIAGNOSTIC;  Surgeon: Emelia Loron, MD;  Location: [facility];  Service: General;  Laterality: N/A;  . STRABISMUS SURGERY Bilateral 1993  . TONSILLECTOMY Bilateral 01/08/2017   Procedure: TONSILLECTOMY;  Surgeon: Osborn Coho, MD;  Location: [facility];  Service: ENT;  Laterality: Bilateral;    There were no vitals filed for this visit.   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 Onset 1 month ago associated with increased running volume training for marathon now 5 weeks  out.    Pertinent History  Pt. with 1-2 year insidious onset left heel pain as well as intermittent left medial ankle region pain. MRI last Spring 2018 revealed some thickening of plantar fascia with findings also consistent with posterior tibial tendinopathy. Pt. seen for PT for 2 previous episodes at [facility] with symptoms improved at the time.    Limitations  --   running-pt. works as Metallurgist long can you sit comfortably?  no limitations    How long can you stand comfortably?  varies    How long can you walk comfortably?  varies    Diagnostic tests  MRI 2018    Patient Stated Goals  Improve tolerance running    Currently in Pain?  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  Multiple Pain Sites  No         [facility] PT Assessment - 06/11/18 0001      Assessment   Medical Diagnosis  Left plantar fasciitis    Referring Provider (PT)  Eula Listen, MD    Onset Date/Surgical Date  05/12/18    Hand Dominance  Right      Precautions   Precautions  None      Restrictions   Weight Bearing Restrictions  No      Balance Screen   Has the patient fallen in the past 6 months  No      Home Environment   Living Environment  Private residence    Living Arrangements  --   lives with husband and 2 children     Prior Function   Level of Independence  --   independent with current limitations   Vocation  --   running coach     Cognition   Overall Cognitive Status  Within Functional Limits for tasks assessed      Observation/Other Assessments   Skin Integrity   small blister inferior to left medial malleolus-pt. reports was from nitro patch    Focus on Therapeutic Outcomes (FOTO)   77      ROM / Strength   AROM / PROM / Strength  AROM;Strength      AROM   AROM Assessment Site  Ankle    Right/Left Ankle  Right;Left    Right Ankle Dorsiflexion  12    Right Ankle Plantar Flexion  70    Right Ankle Inversion  40    Right Ankle Eversion  20    Left Ankle Dorsiflexion  10    Left Ankle Plantar Flexion  60    Left Ankle Inversion  30    Left Ankle Eversion  10      Strength   Strength Assessment Site  Hip;Knee;Ankle    Right/Left Hip  Right;Left    Right Hip Flexion  5/5    Right Hip Extension  5/5    Right Hip External Rotation   5/5    Right Hip Internal Rotation  5/5    Right Hip ABduction  5/5    Right Hip ADduction  5/5    Left Hip Flexion  5/5    Left Hip Extension  4+/5    Left Hip External Rotation  5/5    Left Hip Internal Rotation  5/5    Left Hip ABduction  4+/5    Right/Left Knee  Right;Left    Right Knee Flexion  5/5    Right Knee Extension  5/5    Left Knee Flexion  5/5    Left Knee Extension  5/5    Right/Left Ankle  Right;Left    Right Ankle Dorsiflexion  5/5    Right Ankle Plantar Flexion  5/5    Right Ankle Inversion  5/5    Right Ankle Eversion  5/5    Left Ankle Dorsiflexion  5/5    Left Ankle Plantar Flexion  5/5    Left Ankle Inversion  5/5    Left Ankle Eversion  5/5      Flexibility   Soft Tissue Assessment /Muscle Length  --   gastroc tightness left>right      Palpation   Palpation comment  trigger point left medial gastrocnemius                Objective measurements completed on examination: See above findings.  [facility] Adult PT Treatment/Exercise - 06/11/18 0001      Modalities   Modalities  Ultrasound   incl. also TPDN left gastroc, gluteus medius and piriformis     Ultrasound   Ultrasound Location  left plantar fascia and medial ankle/posterior tibial tenson region     Ultrasound Parameters  3 MHZ 50% 1.0 W/cm2   8 min   Ultrasound Goals  Pain      Manual Therapy   Manual Therapy  Soft tissue mobilization    Soft tissue mobilization  trigger point ischemic compression right medial gastroc, IASTM left plantar fascia             PT Education - 06/11/18 2113    Education Details  POC, etiology symptoms    Person(s) Educated  Patient    Methods  Explanation    Comprehension  Verbalized understanding       PT Short Term Goals - 06/11/18 2121      PT SHORT TERM GOAL #1   Title  Independent with HEP    Baseline  needs update/review    Time  2    Period  Weeks    Status  New      PT SHORT TERM GOAL #2   Title  Decrease pain with running 25-50% from current status 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    Time  2    Period  Weeks    Status  New        PT Long Term Goals - 06/11/18 2124      PT LONG TERM GOAL #1   Title  Decrease pain with running at least 75% from current status 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 presents with left heel pain consistent with referring diagnosis left plantar fasciitis. For medial ankle pain suspect potential posterior tibial tendinitis given past symptoms and MRI findings. Exam findings include gastrocnemius and plantar soft tissue restriction, suspect also contribution from left proximal hip weakness on running mechanics as contributing to pain.  Pt. would benefoit from PT to address current associated functinal limitations for mobility.    History and Personal Factors relevant to plan of care:  Previous episodes of pain, high running volume    Clinical Presentation  Stable    Clinical Presentation due to:  clinical findings stable and consistent with referring diagnosis    Clinical Decision Making  Low    Rehab Potential  Good    Clinical Impairments Affecting Rehab Potential  Previous episodes similar sympoms, good response to past therapy    PT Frequency  2x / week    PT Duration  Other (  comment)   5 weeks   PT Treatment/Interventions  Cryotherapy;Electrical Stimulation;Ultrasound;Moist Heat;Iontophoresis 4mg /ml Dexamethasone;Gait training;Functional mobility training;Therapeutic exercise;Neuromuscular re-education;Patient/family education;Manual techniques;Dry needling;Taping    PT Next Visit Plan  STM plantar region and gastroc, stretches, dry needling, modalities prn    PT Home Exercise Plan  gastroc and plantar fascia stretching, hip abduction and extension strengthening    Consulted and Agree with Plan of Care  Patient       Patient will benefit from skilled therapeutic intervention in order to improve the following deficits and impairments:  Decreased strength, Difficulty walking, Decreased activity tolerance  Visit Diagnosis: Pain in left ankle and joints of left foot - Plan: PT plan of care cert/re-cert  Difficulty in walking, not elsewhere classified - Plan: PT plan of care cert/re-cert     Problem List Patient Active Problem List   Diagnosis Date Noted  . Acute tonsillitis 01/08/2017  . Tonsillitis 01/08/2017  . Plantar fasciitis, left 10/29/2016  . Normal labor 08/08/2015  . Postpartum care following vaginal delivery (11/29) 08/08/2015  . 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 ENTHESOPATHY OF HIP REGION 02/12/2010  . Pain in joint, ankle and foot 12/22/2008  . PES PLANUS 12/22/2008   . UNEQUAL LEG LENGTH 12/22/2008  . ACHILLES TENDON TEAR 12/22/2008    Di Kindle Ayauna Mcnay, PT, DPT 06/11/2018, 9:42 PM  [facility] [address] [place], [place], [address] Phone: [phone]   Fax:  [phone]  Name: Charmian Forbis MRN: [id number] Date of Birth: 1980/11/08

## 2018-06-16 ENCOUNTER — Ambulatory Visit: Payer: BLUE CROSS/BLUE SHIELD | Admitting: Physical Therapy

## 2018-06-16 ENCOUNTER — Other Ambulatory Visit: Payer: Self-pay

## 2018-06-16 DIAGNOSIS — R262 Difficulty in walking, not elsewhere classified: Secondary | ICD-10-CM | POA: Diagnosis not present

## 2018-06-16 DIAGNOSIS — M25572 Pain in left ankle and joints of left foot: Secondary | ICD-10-CM | POA: Diagnosis not present

## 2018-06-16 NOTE — Therapy (Signed)
[facility] [address] [place], [place], [address] Phone: [phone]   Fax:  [phone]  Physical Therapy Treatment  Patient Details  Name: Rebecca Ware MRN: [id number] Date of Birth: 04-16-1981 Referring Provider (PT): Eula Listen, MD   Encounter Date: 06/16/2018  PT End of Session - 06/16/18 1257    Visit Number  2    Number of Visits  10    Date for PT Re-Evaluation  07/16/18    Authorization Type  BCBS    PT Start Time  1102    PT Stop Time  1143    PT Time Calculation (min)  41 min    Activity Tolerance  Patient tolerated treatment well    Behavior During Therapy  [facility] for tasks assessed/performed       Past Medical History:  Diagnosis Date  . 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 Elevated blood pressure reading    x 6 mos. - no current med.  . Panic attacks   . Staph skin infection 01/06/2017   face - started antibiotic 2 weeks ago  . TMJ syndrome     Past Surgical History:  Procedure Laterality Date  . COLPOSCOPY  2011  . DILATION AND EVACUATION  03/29/2011   Procedure: DILATATION AND EVACUATION (D&E);  Surgeon: Genia Del;  Location: [facility];  Service: Gynecology;  Laterality: N/A;  . DILATION AND EVACUATION  07/08/2011   Procedure: DILATATION AND EVACUATION (D&E);  Surgeon: Lenoard Aden, MD;  Location: [facility];  Service: Gynecology;  Laterality: N/A;  . LAPAROSCOPY N/A 01/21/2013   Procedure: LAPAROSCOPY DIAGNOSTIC;  Surgeon: Emelia Loron, MD;  Location: [facility];  Service: General;  Laterality: N/A;  . STRABISMUS SURGERY Bilateral 1993  . TONSILLECTOMY Bilateral 01/08/2017   Procedure: TONSILLECTOMY;  Surgeon: Osborn Coho, MD;  Location: [facility];  Service: ENT;  Laterality: Bilateral;    There were no vitals filed for this visit.  Subjective Assessment - 06/16/18 1248    Subjective  Some soreness noted 1-2 days after tx. at eval but otherwise no new  complaints/concerns this AM.    Pertinent History  Pt. with 1-2 year insidious onset left heel pain as well as intermittent left medial ankle region pain. MRI last Spring 2018 revealed some thickening of plantar fascia with findings also consistent with posterior tibial tendinopathy. Pt. seen for PT for 2 previous episodes at [facility] with symptoms improved at the time.    Limitations  Walking   running   How long can you sit comfortably?  no limitations    How long can you stand comfortably?  varies    How long can you walk comfortably?  varies    Diagnostic tests  MRI 2018    Patient Stated Goals  Improve tolerance running    Currently in Pain?  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    Multiple Pain Sites  No                       [facility] Adult PT Treatment/Exercise - 06/16/18 0001      Exercises   Exercises  Ankle      Modalities   Modalities  Ultrasound      Ultrasound   Ultrasound Location  left plantar fascia/plantar aspect heel    Ultrasound Parameters  3 MHZ 50% 1.0 W/cm2    Ultrasound Goals  Pain      Manual Therapy   Manual Therapy  Joint mobilization;Soft tissue mobilization    Manual therapy comments  STM left gastroc, plantar fascia    Joint Mobilization  Left talocrural grade I-IV distraction    Soft tissue mobilization  Trigger point STM left medial gastroc, IASTM left plantar fascia with Rock tool      Ankle Exercises: Stretches   Gastroc Stretch  3 reps;30 seconds   supine manual stretch      Trigger Point Dry Needling - 06/16/18 1255    Consent Given?  Yes     Education Handout Provided  Yes    Muscles Treated Lower Body  Gastrocnemius   left medial gastroc, piriformis/gluteus medius   Gastrocnemius Response  Twitch response elicited           PT Education - 06/16/18 1257    Education Details  POC    Person(s) Educated  Patient    Methods  Explanation    Comprehension  Verbalized understanding       PT Short Term Goals - 06/16/18 1301      PT SHORT TERM GOAL #1   Title  Independent with HEP    Baseline  needs update/review    Time  2    Period  Weeks      PT SHORT TERM GOAL #2   Title  Decrease pain with running 25-50% from current status 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    Time  2    Period  Weeks        PT Long Term Goals - 06/16/18 1301      PT LONG TERM GOAL #1   Title  Decrease pain with running at least 75% from current status 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 focus STM and modalities to address. Included some tx. to left hip as well due to (hip) weakness impacting running mechanics to contribute to increased foot pressure. Expect progress wil be gradual given current running volume.    History and Personal Factors relevant to plan of care:  previous episodes of pain, high running volume    Clinical Presentation  Stable  Clinical Decision Making  Low    Rehab Potential  Good    Clinical Impairments Affecting Rehab  Potential  Previous episodes similar sympoms, good response to past therapy    PT Frequency  2x / week    PT Duration  --   5 weeks   PT Treatment/Interventions  Cryotherapy;Electrical Stimulation;Ultrasound;Moist Heat;Iontophoresis 4mg /ml Dexamethasone;Gait training;Functional mobility training;Therapeutic exercise;Neuromuscular re-education;Patient/family education;Manual techniques;Dry needling;Taping    PT Next Visit Plan  STM plantar region and gastroc, stretches, dry needling, modalities prn    PT Home Exercise Plan  gastroc and plantar fascia stretching, hip abduction and extension strengthening    Consulted and Agree with Plan of Care  Patient       Patient will benefit from skilled therapeutic intervention in order to improve the following deficits and impairments:  Decreased strength, Difficulty walking, Decreased activity tolerance, Impaired flexibility  Visit Diagnosis: Pain in left ankle and joints of left foot  Difficulty in walking, not elsewhere classified     Problem List Patient Active Problem List   Diagnosis Date Noted  . Acute tonsillitis 01/08/2017  . Tonsillitis 01/08/2017  . Plantar fasciitis, left 10/29/2016  . Normal labor 08/08/2015  . Postpartum care following vaginal delivery (11/29) 08/08/2015  . 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 ENTHESOPATHY OF HIP REGION 02/12/2010  . Pain in joint, ankle and foot 12/22/2008  . PES PLANUS 12/22/2008  . UNEQUAL LEG LENGTH 12/22/2008  . ACHILLES TENDON TEAR 12/22/2008   Lazarus Gowda, PT, DPT 06/16/18 1:10 PM  [facility] [address] [place], [place], [address] Phone: [phone]   Fax:  [phone]  Name: Rebecca Ware MRN: [id number] Date of Birth: 1981/07/11

## 2018-06-19 ENCOUNTER — Encounter

## 2018-06-22 DIAGNOSIS — M79661 Pain in right lower leg: Secondary | ICD-10-CM | POA: Diagnosis not present

## 2018-06-25 ENCOUNTER — Encounter: Payer: Self-pay | Admitting: Physical Therapy

## 2018-06-25 ENCOUNTER — Ambulatory Visit: Payer: BLUE CROSS/BLUE SHIELD | Admitting: Physical Therapy

## 2018-06-25 DIAGNOSIS — M25572 Pain in left ankle and joints of left foot: Secondary | ICD-10-CM

## 2018-06-25 DIAGNOSIS — R262 Difficulty in walking, not elsewhere classified: Secondary | ICD-10-CM | POA: Diagnosis not present

## 2018-06-25 NOTE — Therapy (Signed)
[facility] Outpatient Rehabilitation [facility] [address] [place], [place], [address] Phone: [phone]   Fax:  [phone]  Physical Therapy Treatment  Patient Details  Name: Rebecca Ware MRN: [id number] Date of Birth: April 18, 1981 Referring Provider (PT): Eula Listen, MD   Encounter Date: 06/25/2018  PT End of Session - 06/25/18 1258    Visit Number  3    Number of Visits  10    Date for PT Re-Evaluation  07/16/18    Authorization Type  BCBS    PT Start Time  1146    PT Stop Time  1230    PT Time Calculation (min)  44 min    Activity Tolerance  Patient tolerated treatment well    Behavior During Therapy  [facility] for tasks assessed/performed       Past Medical History:  Diagnosis Date  . 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 Elevated blood pressure reading    x 6 mos. - no current med.  . Panic attacks   . Staph skin infection 01/06/2017   face - started antibiotic 2 weeks ago  . TMJ syndrome     Past Surgical History:  Procedure Laterality Date  . COLPOSCOPY  2011  . DILATION AND EVACUATION  03/29/2011   Procedure: DILATATION AND EVACUATION (D&E);  Surgeon: Genia Del;  Location: [facility];  Service: Gynecology;  Laterality: N/A;  . DILATION AND EVACUATION  07/08/2011   Procedure: DILATATION AND EVACUATION (D&E);  Surgeon: Lenoard Aden, MD;  Location: [facility];  Service: Gynecology;  Laterality: N/A;  . LAPAROSCOPY N/A 01/21/2013   Procedure: LAPAROSCOPY DIAGNOSTIC;  Surgeon: Emelia Loron, MD;  Location: [facility];  Service: General;  Laterality: N/A;  . STRABISMUS SURGERY Bilateral 1993  . TONSILLECTOMY Bilateral 01/08/2017   Procedure: TONSILLECTOMY;  Surgeon: Osborn Coho, MD;  Location: [facility];  Service: ENT;  Laterality: Bilateral;    There were no vitals filed for this visit.  Subjective Assessment - 06/25/18 1253    Subjective  No left foot/heel pain pre-tx. today. Pt. reports strained right  calf on Saturday-she saw Dr. Althea Charon and had [place] which was (-) overt tear/rupture. Right calf since improving.    Pain Score  0-No pain                       [facility] Adult PT Treatment/Exercise - 06/25/18 0001      Exercises   Exercises  Ankle      Modalities   Modalities  Ultrasound      Ultrasound   Ultrasound Location  Left plantar aspect heel/plantar fascia and medial ankle at tibialis posterior region    Ultrasound Parameters  3 MHZ 100% 1.0 W/cm2    Ultrasound Goals  Pain      Manual Therapy   Manual Therapy  Soft tissue mobilization    Soft tissue mobilization  STM include trigger point compression left gastroc medial side focus, STM left plantar fascia including IASTM with Rock tool      Ankle Exercises: Stretches   Plantar Fascia Stretch  3 reps;30 seconds   combo with manual gastroc stretch      Trigger Point Dry Needling - 06/25/18 1257    Consent Given?  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    Time  2    Period  Weeks        PT Long Term Goals - 06/16/18 1301      PT LONG TERM GOAL #1   Title  Decrease pain with running at least 75% from current status 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 responding well to tx. for decreased left foot/heel pain and functional gains for walking and running ability. Mild setback with reported contralateral gastroc strain but overall progressing well re: therapy goals.    Rehab Potential  Good    PT Frequency  2x / week    PT Duration  --   5 weeks   PT Treatment/Interventions  Cryotherapy;Electrical Stimulation;Ultrasound;Moist Heat;Iontophoresis 4mg /ml Dexamethasone;Gait training;Functional mobility training;Therapeutic exercise;Neuromuscular re-education;Patient/family education;Manual techniques;Dry needling;Taping    PT Next Visit Plan  STM plantar region and gastroc, stretches, dry needling, modalities prn    PT Home Exercise Plan  gastroc and plantar fascia stretching, hip abduction and extension strengthening    Consulted and Agree with Plan of Care  Patient       Patient will benefit from skilled therapeutic intervention in order to improve the following deficits and impairments:  Decreased strength, Difficulty walking, Decreased activity tolerance, Impaired flexibility  Visit Diagnosis: Pain in left ankle and joints of left foot  Difficulty in walking, not elsewhere classified     Problem List Patient Active Problem List   Diagnosis Date Noted  . Acute tonsillitis 01/08/2017  . Tonsillitis 01/08/2017  . Plantar fasciitis, left 10/29/2016  . Normal labor 08/08/2015  . Postpartum care following vaginal delivery (11/29) 08/08/2015  . 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 ENTHESOPATHY OF HIP REGION 02/12/2010  . Pain in joint, ankle and foot 12/22/2008  . PES PLANUS 12/22/2008  . UNEQUAL LEG LENGTH 12/22/2008  . ACHILLES  TENDON TEAR 12/22/2008    Lazarus Gowda, PT, DPT 06/25/18 1:02 PM  [facility] [address] [place], [place], [address] Phone: [phone]   Fax:  [phone]  Name: Alesandra Smart MRN: [id number] Date of Birth: 29-May-1981

## 2018-06-30 ENCOUNTER — Encounter: Payer: Self-pay | Admitting: Physical Therapy

## 2018-06-30 ENCOUNTER — Ambulatory Visit: Payer: BLUE CROSS/BLUE SHIELD | Admitting: Physical Therapy

## 2018-06-30 DIAGNOSIS — M25572 Pain in left ankle and joints of left foot: Secondary | ICD-10-CM

## 2018-06-30 DIAGNOSIS — R262 Difficulty in walking, not elsewhere classified: Secondary | ICD-10-CM

## 2018-06-30 NOTE — Therapy (Signed)
[facility] Outpatient Rehabilitation [facility] [address] [place], [place], [address] Phone: [phone]   Fax:  [phone]  Physical Therapy Treatment  Patient Details  Name: Chenee Munns MRN: [id number] Date of Birth: 1981/08/24 Referring Provider (PT): Eula Listen, MD   Encounter Date: 06/30/2018  PT End of Session - 06/30/18 1257    Visit Number  4    Number of Visits  10    Date for PT Re-Evaluation  07/16/18    Authorization Type  BCBS    PT Start Time  1146    PT Stop Time  1230    PT Time Calculation (min)  44 min    Activity Tolerance  Patient tolerated treatment well    Behavior During Therapy  [facility] for tasks assessed/performed       Past Medical History:  Diagnosis Date  . 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 Elevated blood pressure reading    x 6 mos. - no current med.  . Panic attacks   . Staph skin infection 01/06/2017   face - started antibiotic 2 weeks ago  . TMJ syndrome     Past Surgical History:  Procedure Laterality Date  . COLPOSCOPY  2011  . DILATION AND EVACUATION  03/29/2011   Procedure: DILATATION AND EVACUATION (D&E);  Surgeon: Genia Del;  Location: [facility];  Service: Gynecology;  Laterality: N/A;  . DILATION AND EVACUATION  07/08/2011   Procedure: DILATATION AND EVACUATION (D&E);  Surgeon: Lenoard Aden, MD;  Location: [facility];  Service: Gynecology;  Laterality: N/A;  . LAPAROSCOPY N/A 01/21/2013   Procedure: LAPAROSCOPY DIAGNOSTIC;  Surgeon: Emelia Loron, MD;  Location: [facility];  Service: General;  Laterality: N/A;  . STRABISMUS SURGERY Bilateral 1993  . TONSILLECTOMY Bilateral 01/08/2017   Procedure: TONSILLECTOMY;  Surgeon: Osborn Coho, MD;  Location: [facility];  Service: ENT;  Laterality: Bilateral;    There were no vitals filed for this visit.  Subjective Assessment - 06/30/18 1254    Subjective  Reports foot/heel pain continues to improve. No new  complaints/concerns this AM.    Currently in Pain?  No/denies    Pain Score  0-No pain                       [facility] Adult PT Treatment/Exercise - 06/30/18 0001      Modalities   Modalities  Ultrasound      Ultrasound   Ultrasound Location  left plantar fascia and medial foot region    Ultrasound Parameters  3 MHZ 100% 1.0 W/cm2    Ultrasound Goals  Pain      Manual Therapy   Manual Therapy  Soft tissue mobilization    Soft tissue mobilization  STM/IASTM left medial gastroc and plantar fascia incl. use Rock tool      Ankle Exercises: Stretches   Plantar Fascia Stretch  3 reps;30 seconds    Soleus Stretch  3 reps;30 seconds    Gastroc Stretch  3 reps;30 seconds       Trigger Point Dry Needling - 06/30/18 1256    Consent Given?  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    Time  2    Period  Weeks        PT Long Term Goals - 06/16/18 1301      PT LONG TERM GOAL #1   Title  Decrease pain with running at least 75% from current status 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 continues to respond well to therapy for decreased foot/heel pain and associated functional gains for mobility tolerance.    PT Frequency  2x / week    PT Duration  --   5 weeks   PT Treatment/Interventions  Cryotherapy;Electrical Stimulation;Ultrasound;Moist Heat;Iontophoresis 4mg /ml Dexamethasone;Gait training;Functional mobility training;Therapeutic exercise;Neuromuscular re-education;Patient/family education;Manual techniques;Dry needling;Taping    PT Next Visit Plan  STM plantar region and gastroc, stretches, dry needling, modalities prn    PT Home Exercise Plan  gastroc and plantar fascia stretching, hip abduction and extension strengthening    Consulted and Agree with Plan of Care  Patient       Patient will benefit from skilled therapeutic intervention in order to improve the following deficits and impairments:  Decreased strength, Difficulty walking, Decreased activity tolerance, Impaired flexibility  Visit Diagnosis: Pain in left ankle and joints of left foot  Difficulty in walking, not elsewhere classified     Problem List Patient Active Problem List   Diagnosis Date Noted  . Acute tonsillitis 01/08/2017  . Tonsillitis 01/08/2017  . Plantar fasciitis, left 10/29/2016  . Normal labor 08/08/2015  . Postpartum care following vaginal delivery (11/29) 08/08/2015  . 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 ENTHESOPATHY OF HIP REGION 02/12/2010  . Pain in joint, ankle and foot 12/22/2008  . PES PLANUS 12/22/2008  . UNEQUAL LEG LENGTH 12/22/2008  . ACHILLES TENDON TEAR 12/22/2008    Lazarus Gowda, PT, DPT 06/30/18 12:59 PM  [facility] [address] [place], [place], [address] Phone: [phone]   Fax:  [phone]  Name: Micole Delehanty MRN: [id number] Date of Birth: [birth year]

## 2018-07-03 ENCOUNTER — Ambulatory Visit: Payer: BLUE CROSS/BLUE SHIELD | Admitting: Physical Therapy

## 2018-07-08 ENCOUNTER — Ambulatory Visit: Payer: BLUE CROSS/BLUE SHIELD | Admitting: Physical Therapy

## 2018-07-08 ENCOUNTER — Encounter: Payer: Self-pay | Admitting: Physical Therapy

## 2018-07-08 DIAGNOSIS — R262 Difficulty in walking, not elsewhere classified: Secondary | ICD-10-CM | POA: Diagnosis not present

## 2018-07-08 DIAGNOSIS — M25572 Pain in left ankle and joints of left foot: Secondary | ICD-10-CM

## 2018-07-08 NOTE — Therapy (Signed)
[place], [place], [address] Phone: [phone]   Fax:  [phone]  Physical Therapy Treatment  Patient Details  Name: Rebecca Ware MRN: [id number] Date of Birth: 1980-11-14 Referring Provider (PT): Rhina Brackett, MD   Encounter Date: 07/08/2018  PT End of Session - 07/08/18 1257    Visit Number  5    Number of Visits  10    Date for PT Re-Evaluation  07/16/18    Authorization Type  BCBS    PT Start Time  1540    PT Stop Time  1232    PT Time Calculation (min)  47 min    Activity Tolerance  Patient tolerated treatment well    Behavior During Therapy  [facility] for tasks assessed/performed       Past Medical History:  Diagnosis Date  . 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 Elevated blood pressure reading    x 6 mos. - no current med.  . Panic attacks   . Staph skin infection 01/06/2017   face - started antibiotic 2 weeks ago  . TMJ syndrome     Past Surgical History:  Procedure Laterality Date  . COLPOSCOPY  2011  . DILATION AND EVACUATION  03/29/2011   Procedure: DILATATION AND EVACUATION (D&E);  Surgeon: Princess Bruins;  Location: [place] [facility];  Service: Gynecology;  Laterality: N/A;  . DILATION AND EVACUATION  07/08/2011   Procedure: DILATATION AND EVACUATION (D&E);  Surgeon: Lovenia Kim, MD;  Location: [place] [facility];  Service: Gynecology;  Laterality: N/A;  . LAPAROSCOPY N/A 01/21/2013   Procedure: LAPAROSCOPY DIAGNOSTIC;  Surgeon: Rolm Bookbinder, MD;  Location: [facility];  Service: General;  Laterality: N/A;  . STRABISMUS SURGERY Bilateral 1993  . TONSILLECTOMY Bilateral 01/08/2017   Procedure: TONSILLECTOMY;  Surgeon: Jerrell Belfast, MD;  Location: [place];  Service: ENT;  Laterality: Bilateral;    There were no vitals filed for this visit.  Subjective Assessment - 07/08/18 1249    Subjective  Left foot pain continues to improve, now noting more soreness  right lower gastroc region s/p strain incident several weeks ago. 1 1/2 weeks until marathon.    Currently in Pain?  No/denies    Pain Score  0-No pain         [facility] PT Assessment - 07/08/18 0001      AROM   Right Ankle Dorsiflexion  10    Right Ankle Plantar Flexion  60    Right Ankle Inversion  40    Right Ankle Eversion  15    Left Ankle Dorsiflexion  10    Left Ankle Plantar Flexion  60    Left Ankle Inversion  40    Left Ankle Eversion  15      Strength   Right/Left Hip  --   Bilat. LE grossly 5/5 except Rt. PF not tested due to strain                  [facility] Adult PT Treatment/Exercise - 07/08/18 0001      Exercises   Exercises  Ankle      Modalities   Modalities  Ultrasound      Ultrasound   Ultrasound Location  left plantar fascia and medial ankle    Ultrasound Parameters  3 MHZ 100% 1.0 W/cm2    Ultrasound Goals  Pain      Manual Therapy   Manual Therapy  Soft tissue mobilization    Soft tissue  mobilization  Trigger point STM left medial gastroc, STM and IASTM left plantar fascia including use Rock tool      Ankle Exercises: Clinical research associate  3 reps;30 seconds       Trigger Point Dry Needling - 07/08/18 1256    Consent Given?  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    Baseline  met    Time  2    Period  Weeks    Status  Achieved        PT Long  Term Goals - 07/08/18 1300      PT LONG TERM GOAL #1   Title  Decrease pain with running at least 75% from current status 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    Time  5    Period  Weeks    Status  On-going      PT [place] #2   Title  Left hip strength 5/5 for abduction and extension to improve gait/running mechanics to decrease heel pain    Time  5    Period  Weeks    Status  Achieved      PT LONG TERM GOAL #3   Title  Tolerate standing/walking for household IADLs and community ambulation, shopping without limitation due to heel pain    Time  5    Period  Weeks    Status  Achieved            Plan - 07/08/18 1257    Clinical Impression Statement  Continued progress as previously for left foot/ankle pain though some impact on overall mobility status due to right gastroc region strain several weeks ago also gradually improving.    Rehab Potential  Good    Clinical Impairments Affecting Rehab Potential  Previous episodes similar sympoms, good response to past therapy    PT Frequency  2x / week    PT Duration  --   5 weeks   PT Treatment/Interventions  Cryotherapy;Electrical Stimulation;Ultrasound;Moist Heat;Iontophoresis '4mg'$ /ml Dexamethasone;Gait training;Functional mobility training;Therapeutic exercise;Neuromuscular re-education;Patient/family education;Manual techniques;Dry needling;Taping    PT Next Visit Plan  STM plantar region and gastroc, stretches, dry needling, modalities prn, possible d/c to HEP next tx. pending status    PT Home Exercise Plan  gastroc and plantar fascia stretching, hip abduction and extension strengthening    Consulted and Agree with Plan of Care  Patient       Patient will benefit from skilled therapeutic intervention in order to improve the following deficits and impairments:  Decreased strength, Difficulty walking, Decreased activity tolerance, Impaired flexibility  Visit Diagnosis: Pain in left ankle and joints  of  left foot  Difficulty in walking, not elsewhere classified     Problem List Patient Active Problem List   Diagnosis Date Noted  . Acute tonsillitis 01/08/2017  . Tonsillitis 01/08/2017  . Plantar fasciitis, left 10/29/2016  . Normal labor 08/08/2015  . Postpartum care following vaginal delivery (11/29) 08/08/2015  . 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 ENTHESOPATHY OF HIP REGION 02/12/2010  . Pain in joint, ankle and foot 12/22/2008  . PES PLANUS 12/22/2008  . UNEQUAL LEG LENGTH 12/22/2008  . ACHILLES TENDON TEAR 12/22/2008    Beaulah Dinning, PT, DPT 07/08/18 1:03 PM  [facility] [address] [place], [place], [address] Phone: [phone]   Fax:  [phone]  Name: Milisa Kimbell MRN: [id number] Date of Birth: Aug 11, 1981

## 2018-07-14 ENCOUNTER — Encounter

## 2018-07-27 DIAGNOSIS — M79661 Pain in right lower leg: Secondary | ICD-10-CM | POA: Diagnosis not present

## 2018-07-29 DIAGNOSIS — M79661 Pain in right lower leg: Secondary | ICD-10-CM | POA: Diagnosis not present

## 2018-07-30 ENCOUNTER — Ambulatory Visit: Payer: BLUE CROSS/BLUE SHIELD | Attending: Family Medicine | Admitting: Physical Therapy

## 2018-07-30 ENCOUNTER — Encounter: Payer: Self-pay | Admitting: Physical Therapy

## 2018-07-30 DIAGNOSIS — M25572 Pain in left ankle and joints of left foot: Secondary | ICD-10-CM

## 2018-07-30 DIAGNOSIS — R262 Difficulty in walking, not elsewhere classified: Secondary | ICD-10-CM | POA: Diagnosis not present

## 2018-07-30 DIAGNOSIS — M79661 Pain in right lower leg: Secondary | ICD-10-CM | POA: Diagnosis not present

## 2018-07-30 DIAGNOSIS — R600 Localized edema: Secondary | ICD-10-CM | POA: Diagnosis not present

## 2018-07-30 NOTE — Therapy (Signed)
[place], [place], [address] Phone: [phone]   Fax:  [phone]  Physical Therapy Treatment/Re-evaluation  Patient Details  Name: Rebecca Ware MRN: [id number] Date of Birth: [birth year] Referring Provider (PT): Rhina Brackett, MD   Encounter Date: 07/30/2018  PT End of Session - 07/30/18 1301    Visit Number  6    Number of Visits  14    Date for PT Re-Evaluation  09/10/18    PT Start Time  0932    PT Stop Time  8338    PT Time Calculation (min)  42 min    Activity Tolerance  Patient tolerated treatment well    Behavior During Therapy  [facility] for tasks assessed/performed       Past Medical History:  Diagnosis Date  . 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 Elevated blood pressure reading    x 6 mos. - no current med.  . Panic attacks   . Staph skin infection 01/06/2017   face - started antibiotic 2 weeks ago  . TMJ syndrome     Past Surgical History:  Procedure Laterality Date  . COLPOSCOPY  2011  . DILATION AND EVACUATION  03/29/2011   Procedure: DILATATION AND EVACUATION (D&E);  Surgeon: Princess Bruins;  Location: [facility];  Service: Gynecology;  Laterality: N/A;  . DILATION AND EVACUATION  07/08/2011   Procedure: DILATATION AND EVACUATION (D&E);  Surgeon: Lovenia Kim, MD;  Location: [place] [facility];  Service: Gynecology;  Laterality: N/A;  . LAPAROSCOPY N/A 01/21/2013   Procedure: LAPAROSCOPY DIAGNOSTIC;  Surgeon: Rolm Bookbinder, MD;  Location: [facility];  Service: General;  Laterality: N/A;  . STRABISMUS SURGERY Bilateral 1993  . TONSILLECTOMY Bilateral 01/08/2017   Procedure: TONSILLECTOMY;  Surgeon: Jerrell Belfast, MD;  Location: [place];  Service: ENT;  Laterality: Bilateral;    There were no vitals filed for this visit.  Subjective Assessment - 07/30/18 1056    Subjective  Pt. returns (not seen since 06/28/18) with new PT prescription for small partial  tear in right gastrocnemius. Pt. initially had strain injury with running 06/20/18 and had been gradually improving but then symptoms re-exacerbated with running marathon 07/18/18.  Pt. had MRI which revealed small tear in distal medial portion of gastrocnemius. Left foot/heel pain from previous PT tx. improved. Primary complaint is distal medial gastroc pain and limited ability to run. Pt. wants to be able to recover in time for next marathon qualifier in [facility] 09/27/18.    Pertinent History  initial strain 06/20/18 with re-exacerbation 07/18/18 for right gastroc pain, previous left heel/ankle pain    Limitations  Walking   running   How long can you sit comfortably?  no limitations    How long can you walk comfortably?  varies    Diagnostic tests  MRI 2018 left foot/ankle, MRI right gastrocnemius 07/28/18    Patient Stated Goals  Improve tolerance running and run in qualifier for [place] 09/27/17    Currently in Pain?  Yes    Pain Score  2     Pain Location  Leg    Pain Orientation  Right;Lower;Medial    Pain Descriptors / Indicators  Aching    Pain Type  Acute pain    Pain Onset  --   initial onset 06/20/18 with re-exacerbation 07/18/18   Pain Frequency  Intermittent    Aggravating Factors   walking, running    Pain Relieving Factors  rest  [facility] PT Assessment - 07/30/18 0001      AROM   AROM Assessment Site  Ankle    Right/Left Ankle  Right;Left    Right Ankle Dorsiflexion  10    Right Ankle Plantar Flexion  55    Right Ankle Inversion  40    Right Ankle Eversion  20    Left Ankle Dorsiflexion  10    Left Ankle Plantar Flexion  65    Left Ankle Inversion  40    Left Ankle Eversion  20      Strength   Right Hip Flexion  5/5    Right Hip Extension  5/5    Right Hip External Rotation   5/5    Right Hip Internal Rotation  5/5    Right Hip ABduction  5/5    Right Hip ADduction  5/5    Left Hip Flexion  5/5    Left Hip Extension  5/5    Left Hip External Rotation  5/5     Left Hip Internal Rotation  5/5    Left Hip ABduction  4+/5    Right Knee Flexion  5/5    Right Knee Extension  5/5    Left Knee Flexion  5/5    Left Knee Extension  5/5    Right Ankle Dorsiflexion  5/5    Right Ankle Plantar Flexion  --   not tested   Right Ankle Inversion  5/5    Right Ankle Eversion  5/5    Left Ankle Dorsiflexion  5/5    Left Ankle Plantar Flexion  5/5    Left Ankle Inversion  5/5    Left Ankle Eversion  5/5      Palpation   Palpation comment  edema with tenderness right distal medial gastroc                   [facility] Adult PT Treatment/Exercise - 07/30/18 0001      Exercises   Exercises  Ankle      Modalities   Modalities  Electrical Stimulation;Ultrasound      Electrical Stimulation   Electrical Stimulation Location  Right distal medial gastrocnemius    Electrical Stimulation Action  TENS    Electrical Stimulation Parameters  100 pps x 10 min    Electrical Stimulation Goals  Edema;Pain      Ultrasound   Ultrasound Location  Right gastrocnemius    Ultrasound Parameters  1 MHZ 100% 1.0 W/cm2    Ultrasound Goals  Edema   tissue healing     Manual Therapy   Manual Therapy  Soft tissue mobilization    Soft tissue mobilization  brief gentle STM right gastroc       Ankle Exercises: Supine   Other Supine Ankle Exercises  ankle PF/DF AROM x 20       Trigger Point Dry Needling - 07/30/18 1257    Consent Given?  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    Baseline  met for left foot but continue to include for right gastroc    Time  3    Period  Weeks    Status  Revised        PT Long Term Goals - 07/30/18 1307      PT LONG TERM GOAL #1   Title  Decrease pain with running at least 75% from current status 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    Baseline  previously met for left foot but continue goal for right gastroc    Time  6    Period  Weeks    Status  Revised      PT LONG TERM GOAL #2   Title  Left hip strength 5/5 for abduction and extension to improve gait/running mechanics to decrease heel pain (now include right gastroc strength 5/5)    Baseline  met for extension but not abduction, update to include right gastroc strength 5/5    Time  6    Period  Weeks    Status  Revised      PT LONG TERM GOAL #3   Title  Tolerate standing/walking for household IADLs and community ambulation, shopping without limitation due to right gastrocnemius pain    Time  6    Status  Revised            Plan - 07/30/18 1302    Clinical Impression Statement  Pt. presents with right gastrocnemius pain s/p strain/partial tear injury with associated edema, muscle weakness and decreased ROM. Pt. would benefit from PT to help address current functional limitations for mobility and assist return to PLOF.    History and Personal Factors relevant to plan of care:  high running volume needed for upcoming trials    Clinical Presentation  Stable    Clinical Decision Making  Low    Rehab Potential  Good    Clinical Impairments Affecting Rehab Potential  good tissue healing potential with respect to age and health of patient    PT Frequency  2x / week    PT Duration  6 weeks    PT Treatment/Interventions  Iontophoresis '4mg'$ /ml Dexamethasone;Cryotherapy;Electrical Stimulation;Ultrasound;Dry needling;Gait  training;Functional mobility training;Moist Heat;Therapeutic activities;Therapeutic exercise;Balance training;Patient/family education;Manual techniques;Vasopneumatic Device;Taping;Neuromuscular re-education    PT Next Visit Plan  ROM, modalities, gentle STM, activity progression pending pain    PT Home Exercise Plan  heel raises pending pain, ROM, hold aggressive stretching       Patient will benefit from skilled therapeutic intervention in order to improve the following deficits and impairments:  Abnormal gait, Pain, Increased edema, Difficulty walking, Decreased range of motion, Decreased strength, Decreased activity tolerance, Decreased endurance  Visit Diagnosis: Pain in right lower leg  Localized edema  Difficulty in walking, not elsewhere classified  Pain in left ankle and joints of left foot     Problem List Patient Active Problem List   Diagnosis Date Noted  . Acute tonsillitis 01/08/2017  . Tonsillitis 01/08/2017  . Plantar fasciitis, left 10/29/2016  . Normal labor 08/08/2015  . Postpartum care following vaginal delivery (11/29) 08/08/2015  . 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  ENTHESOPATHY OF HIP REGION 02/12/2010  . Pain in joint, ankle and foot 12/22/2008  . PES PLANUS 12/22/2008  . UNEQUAL LEG LENGTH 12/22/2008  . ACHILLES TENDON TEAR 12/22/2008    Beaulah Dinning, PT, DPT 07/30/18 1:11 PM  [facility] [address] [place], [place], [address] Phone: [phone]   Fax:  [phone]  Name: Rebecca Ware MRN: [id number] Date of Birth: [birth year]

## 2018-08-04 ENCOUNTER — Encounter: Payer: BLUE CROSS/BLUE SHIELD | Admitting: Physical Therapy

## 2018-08-05 ENCOUNTER — Encounter

## 2018-08-05 ENCOUNTER — Encounter: Payer: Self-pay | Admitting: Physical Therapy

## 2018-08-05 ENCOUNTER — Ambulatory Visit: Payer: BLUE CROSS/BLUE SHIELD | Admitting: Physical Therapy

## 2018-08-05 DIAGNOSIS — R262 Difficulty in walking, not elsewhere classified: Secondary | ICD-10-CM | POA: Diagnosis not present

## 2018-08-05 DIAGNOSIS — M79661 Pain in right lower leg: Secondary | ICD-10-CM

## 2018-08-05 DIAGNOSIS — R600 Localized edema: Secondary | ICD-10-CM | POA: Diagnosis not present

## 2018-08-05 DIAGNOSIS — M25572 Pain in left ankle and joints of left foot: Secondary | ICD-10-CM

## 2018-08-05 NOTE — Therapy (Signed)
[place], [place], [address] Phone: [phone]   Fax:  [phone]  Physical Therapy Treatment  Patient Details  Name: Rebecca Ware MRN: [id number] Date of Birth: 02/13/1981 Referring Provider (PT): Rhina Brackett, MD   Encounter Date: 08/05/2018  PT End of Session - 08/05/18 1023    Visit Number  7    Number of Visits  14    Date for PT Re-Evaluation  09/10/18    Authorization Type  BCBS    PT Start Time  0928    PT Stop Time  1014    PT Time Calculation (min)  46 min    Activity Tolerance  Patient tolerated treatment well    Behavior During Therapy  [facility] for tasks assessed/performed       Past Medical History:  Diagnosis Date  . 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 Elevated blood pressure reading    x 6 mos. - no current med.  . Panic attacks   . Staph skin infection 01/06/2017   face - started antibiotic 2 weeks ago  . TMJ syndrome     Past Surgical History:  Procedure Laterality Date  . COLPOSCOPY  2011  . DILATION AND EVACUATION  03/29/2011   Procedure: DILATATION AND EVACUATION (D&E);  Surgeon: Princess Bruins;  Location: [facility];  Service: Gynecology;  Laterality: N/A;  . DILATION AND EVACUATION  07/08/2011   Procedure: DILATATION AND EVACUATION (D&E);  Surgeon: Lovenia Kim, MD;  Location: [facility];  Service: Gynecology;  Laterality: N/A;  . LAPAROSCOPY N/A 01/21/2013   Procedure: LAPAROSCOPY DIAGNOSTIC;  Surgeon: Rolm Bookbinder, MD;  Location: [facility];  Service: General;  Laterality: N/A;  . STRABISMUS SURGERY Bilateral 1993  . TONSILLECTOMY Bilateral 01/08/2017   Procedure: TONSILLECTOMY;  Surgeon: Jerrell Belfast, MD;  Location: [place];  Service: ENT;  Laterality: Bilateral;    There were no vitals filed for this visit.  Subjective Assessment - 08/05/18 1018    Subjective  No pain at rest and with ambulation pre-tx. but still having right  gastroc pain with running.    Currently in Pain?  No/denies    Pain Score  0-No pain         [facility] PT Assessment - 08/05/18 0001      Observation/Other Assessments   Focus on Therapeutic Outcomes (FOTO)   70/30% limited                   [facility] Adult PT Treatment/Exercise - 08/05/18 0001      Electrical Stimulation   Electrical Stimulation Location  Right distal medial gastroc    Electrical Stimulation Action  TENS   applied via needles   Electrical Stimulation Parameters  100 pps x 10 min during manual tx. to left gastroc    Electrical Stimulation Goals  Edema;Pain      Ultrasound   Ultrasound Location  Right gastrocnemius    Ultrasound Parameters  1 MHZ 20% 1.0 W/cm2 (muscle injury setting)    Ultrasound Goals  Edema;Pain   tissue healing     Manual Therapy   Manual Therapy  Joint mobilization;Soft tissue mobilization    Joint Mobilization  left talocural mobilization AP grade I-IV    Soft tissue mobilization  gentle STM right gastroc, STM left medial gastroc      Ankle Exercises: Stretches   Gastroc Stretch  3 reps;30 seconds   supine manual stretches very gentle on right  Ankle Exercises: Supine   Other Supine Ankle Exercises  ankle PF/DF x 20 om right       Trigger Point Dry Needling - 08/05/18 1027    Consent Given?  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    Baseline  met for left  foot but continue to include for right gastroc    Time  3    Period  Weeks    Status  Revised        PT Long Term Goals - 07/30/18 1307      PT LONG TERM GOAL #1   Title  Decrease pain with running at least 75% from current status 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    Baseline  previously met for left foot but continue goal for right gastroc    Time  6    Period  Weeks    Status  Revised      PT LONG TERM GOAL #2   Title  Left hip strength 5/5 for abduction and extension to improve gait/running mechanics to decrease heel pain (now include right gastroc strength 5/5)    Baseline  met for extension but not abduction, update to include right gastroc strength 5/5    Time  6    Period  Weeks    Status  Revised      PT LONG TERM GOAL #3   Title  Tolerate standing/walking for household IADLs and community ambulation, shopping without limitation due to right gastrocnemius pain    Time  6    Status  Revised            Plan - 08/05/18 1025    Clinical Impression Statement  Still with some pain with dynamic activities as noted in subjetive. Given etiology right gastroc injury expect tissue healing will take more time/progress gradually.    Clinical Impairments Affecting Rehab Potential  good tissue healing potential with respect to age and health of patient    PT Frequency  2x / week    PT Duration  6 weeks    PT Treatment/Interventions  Iontophoresis 52m/ml Dexamethasone;Cryotherapy;Electrical Stimulation;Ultrasound;Dry needling;Gait training;Functional mobility training;Moist Heat;Therapeutic activities;Therapeutic exercise;Balance training;Patient/family education;Manual techniques;Vasopneumatic Device;Taping;Neuromuscular re-education    PT Next Visit Plan  ROM, modalities, gentle STM, activity progression pending pain    PT Home Exercise Plan  heel raises pending pain, ROM, hold aggressive stretching    Consulted and Agree with Plan of Care  Patient        Patient will benefit from skilled therapeutic intervention in order to improve the following deficits and impairments:  Abnormal gait, Pain, Increased edema, Difficulty walking, Decreased range of motion,  Decreased strength, Decreased activity tolerance, Decreased endurance  Visit Diagnosis: Pain in right lower leg  Localized edema  Difficulty in walking, not elsewhere classified  Pain in left ankle and joints of left foot     Problem List Patient Active Problem List   Diagnosis Date Noted  . Acute tonsillitis 01/08/2017  . Tonsillitis 01/08/2017  . Plantar fasciitis, left 10/29/2016  . Normal labor 08/08/2015  . Postpartum care following vaginal delivery (11/29) 08/08/2015  . 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 ENTHESOPATHY OF HIP REGION 02/12/2010  . Pain in joint, ankle and foot 12/22/2008  . PES PLANUS 12/22/2008  . UNEQUAL LEG LENGTH 12/22/2008  . ACHILLES TENDON TEAR 12/22/2008   Beaulah Dinning, PT, DPT 08/05/18 10:28 AM  [facility] [address] [place], [place], [address] Phone: [phone]   Fax:  [phone]  Name: Rebecca Ware MRN: [id number] Date of Birth: 07-19-1981

## 2018-08-13 ENCOUNTER — Encounter

## 2018-08-14 ENCOUNTER — Ambulatory Visit: Payer: BLUE CROSS/BLUE SHIELD | Attending: Family Medicine | Admitting: Physical Therapy

## 2018-08-14 ENCOUNTER — Encounter: Payer: Self-pay | Admitting: Physical Therapy

## 2018-08-14 DIAGNOSIS — R262 Difficulty in walking, not elsewhere classified: Secondary | ICD-10-CM | POA: Insufficient documentation

## 2018-08-14 DIAGNOSIS — M79661 Pain in right lower leg: Secondary | ICD-10-CM | POA: Diagnosis not present

## 2018-08-14 DIAGNOSIS — M25572 Pain in left ankle and joints of left foot: Secondary | ICD-10-CM | POA: Diagnosis not present

## 2018-08-14 DIAGNOSIS — R600 Localized edema: Secondary | ICD-10-CM | POA: Diagnosis not present

## 2018-08-14 NOTE — Therapy (Signed)
[place], [place], [address] Phone: [phone]   Fax:  [phone]  Physical Therapy Treatment  Patient Details  Name: Rebecca Ware MRN: [id number] Date of Birth: 1981-03-15 Referring Provider (PT): Rhina Brackett, MD   Encounter Date: 08/14/2018  PT End of Session - 08/14/18 1115    Visit Number  8    Number of Visits  14    Date for PT Re-Evaluation  09/10/18    Authorization Type  BCBS    PT Start Time  0932    PT Stop Time  1017    PT Time Calculation (min)  45 min    Activity Tolerance  Patient tolerated treatment well    Behavior During Therapy  [facility] for tasks assessed/performed       Past Medical History:  Diagnosis Date  . 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 Elevated blood pressure reading    x 6 mos. - no current med.  . Panic attacks   . Staph skin infection 01/06/2017   face - started antibiotic 2 weeks ago  . TMJ syndrome     Past Surgical History:  Procedure Laterality Date  . COLPOSCOPY  2011  . DILATION AND EVACUATION  03/29/2011   Procedure: DILATATION AND EVACUATION (D&E);  Surgeon: Princess Bruins;  Location: [facility];  Service: Gynecology;  Laterality: N/A;  . DILATION AND EVACUATION  07/08/2011   Procedure: DILATATION AND EVACUATION (D&E);  Surgeon: Lovenia Kim, MD;  Location: [facility];  Service: Gynecology;  Laterality: N/A;  . LAPAROSCOPY N/A 01/21/2013   Procedure: LAPAROSCOPY DIAGNOSTIC;  Surgeon: Rolm Bookbinder, MD;  Location: [facility];  Service: General;  Laterality: N/A;  . STRABISMUS SURGERY Bilateral 1993  . TONSILLECTOMY Bilateral 01/08/2017   Procedure: TONSILLECTOMY;  Surgeon: Jerrell Belfast, MD;  Location: [place];  Service: ENT;  Laterality: Bilateral;    There were no vitals filed for this visit.  Subjective Assessment - 08/14/18 1109    Subjective  No pain walking but still limited with running tolerance with right  gastroc pain s/p partial tear. Pt. performing HEP for light ROM/strengthening.    Currently in Pain?  No/denies                       [facility] Adult PT Treatment/Exercise - 08/14/18 0001      Electrical Stimulation   Electrical Stimulation Location  Right distal medial gastroc    Electrical Stimulation Action  TENS   100 pps applied via needles   Electrical Stimulation Parameters  100 pps x 10 min   performed during manual tx. to left gastroc and foot   Electrical Stimulation Goals  Edema;Pain      Ultrasound   Ultrasound Location  Right gastrocnemius-distal medial portion    Ultrasound Parameters  1 MHZ 100% 1.0 W/cm2    Ultrasound Goals  Edema;Pain      Manual Therapy   Manual Therapy  Soft tissue mobilization    Soft tissue mobilization  Bilateral gastrocnemius, left plantar fascia including IASTM left foot      Ankle Exercises: Stretches   Gastroc Stretch  3 reps;20 seconds   gentle supine manual stretches      Trigger Point Dry Needling - 08/14/18 1111    Consent Given?  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    Baseline  not met    Time  3    Period  Weeks    Status  On-going        PT Long Term Goals - 08/14/18 1117      PT LONG TERM GOAL #1   Title  Decrease pain with running at least 75% from current status 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    Baseline  not met    Time  6    Period  Weeks    Status  On-going      PT  LONG TERM GOAL #2   Title  Left hip strength 5/5 for abduction and extension, right gastroc strength 5/5 to improve gait/running mechanics to decrease heel pain (now include right gastroc strength 5/5)    Baseline  met for extension but not abduction, gastroc not tested    Time  6    Period  Weeks    Status  On-going      PT LONG TERM GOAL #3   Title  Tolerate standing/walking for household IADLs and community ambulation, shopping without limitation due to right gastrocnemius pain    Baseline  met    Time  6    Period  Weeks    Status  Achieved            Plan - 08/14/18 1115    Clinical Impression Statement  Fair status for gastroc pain with running but given etiology tear expect healing will continue to take time. Passive tx. focus as pt. has been performing HEP.     PT Frequency  2x / week    PT Duration  6 weeks    PT Next Visit Plan  ROM, modalities, gentle STM, activity progression pending pain    PT Home Exercise Plan  heel raises, gentle stretches    Consulted and Agree with Plan of Care  Patient       Patient will benefit from skilled therapeutic intervention in order to improve the following deficits and impairments:  Abnormal gait, Pain, Increased edema, Difficulty walking, Decreased range of motion, Decreased strength, Decreased activity tolerance, Decreased endurance  Visit Diagnosis: Pain in right lower leg  Localized edema  Difficulty in walking, not elsewhere classified  Pain in left ankle and joints of left foot     Problem List Patient Active Problem List   Diagnosis Date Noted  . Acute tonsillitis 01/08/2017  . Tonsillitis 01/08/2017  . Plantar fasciitis, left 10/29/2016  . Normal labor 08/08/2015  . Postpartum care following vaginal delivery (11/29) 08/08/2015  . 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 ENTHESOPATHY OF HIP REGION 02/12/2010  . Pain in joint, ankle and foot 12/22/2008  . PES PLANUS 12/22/2008  . UNEQUAL  LEG LENGTH 12/22/2008  . ACHILLES TENDON TEAR 12/22/2008  Beaulah Dinning, PT, DPT 08/14/18 11:19 AM  [facility] [address] [place], [place], [address] Phone: [phone]   Fax:  [phone]  Name: Rebecca Ware MRN: [id number] Date of Birth: 21-Jan-1981

## 2018-08-18 ENCOUNTER — Encounter: Payer: Self-pay | Admitting: Physical Therapy

## 2018-08-18 ENCOUNTER — Ambulatory Visit: Payer: BLUE CROSS/BLUE SHIELD | Admitting: Physical Therapy

## 2018-08-18 DIAGNOSIS — R262 Difficulty in walking, not elsewhere classified: Secondary | ICD-10-CM | POA: Diagnosis not present

## 2018-08-18 DIAGNOSIS — R600 Localized edema: Secondary | ICD-10-CM

## 2018-08-18 DIAGNOSIS — M25572 Pain in left ankle and joints of left foot: Secondary | ICD-10-CM

## 2018-08-18 DIAGNOSIS — M79661 Pain in right lower leg: Secondary | ICD-10-CM

## 2018-08-18 NOTE — Therapy (Signed)
[place], [place], [address] Phone: [phone]   Fax:  [phone]  Physical Therapy Treatment  Patient Details  Name: Rebecca Ware MRN: [id number] Date of Birth: 06/17/1981 Referring Provider (PT): Rhina Brackett, MD   Encounter Date: 08/18/2018  PT End of Session - 08/18/18 1305    Visit Number  9    Number of Visits  14    Date for PT Re-Evaluation  09/10/18    Authorization Type  BCBS    PT Start Time  1017    PT Stop Time  1100    PT Time Calculation (min)  43 min    Activity Tolerance  Patient tolerated treatment well    Behavior During Therapy  [facility] for tasks assessed/performed       Past Medical History:  Diagnosis Date  . 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 Elevated blood pressure reading    x 6 mos. - no current med.  . Panic attacks   . Staph skin infection 01/06/2017   face - started antibiotic 2 weeks ago  . TMJ syndrome     Past Surgical History:  Procedure Laterality Date  . COLPOSCOPY  2011  . DILATION AND EVACUATION  03/29/2011   Procedure: DILATATION AND EVACUATION (D&E);  Surgeon: Princess Bruins;  Location: [facility];  Service: Gynecology;  Laterality: N/A;  . DILATION AND EVACUATION  07/08/2011   Procedure: DILATATION AND EVACUATION (D&E);  Surgeon: Lovenia Kim, MD;  Location: [facility];  Service: Gynecology;  Laterality: N/A;  . LAPAROSCOPY N/A 01/21/2013   Procedure: LAPAROSCOPY DIAGNOSTIC;  Surgeon: Rolm Bookbinder, MD;  Location: [facility];  Service: General;  Laterality: N/A;  . STRABISMUS SURGERY Bilateral 1993  . TONSILLECTOMY Bilateral 01/08/2017   Procedure: TONSILLECTOMY;  Surgeon: Jerrell Belfast, MD;  Location: [place];  Service: ENT;  Laterality: Bilateral;    There were no vitals filed for this visit.  Subjective Assessment - 08/18/18 1301    Subjective  Pt. has held off on running to allow recovery. Minimal pain with  ambulation for day to day activities (but still unable to run).    Currently in Pain?  No/denies    Pain Score  0-No pain         [facility] PT Assessment - 08/18/18 0001      AROM   Right Ankle Dorsiflexion  10    Right Ankle Plantar Flexion  70    Right Ankle Inversion  40    Right Ankle Eversion  20    Left Ankle Dorsiflexion  10    Left Ankle Plantar Flexion  70    Left Ankle Inversion  40    Left Ankle Eversion  20                   [facility] Adult PT Treatment/Exercise - 08/18/18 0001      Electrical Stimulation   Electrical Stimulation Location  Right distal medial gastroc    Electrical Stimulation Action  TENS    Electrical Stimulation Parameters  100 pps x 10 min   applied via needles, during manual tx. to left gastroc/foot   Electrical Stimulation Goals  Edema;Pain      Ultrasound   Ultrasound Location  Right gastrocnemius    Ultrasound Parameters  1 MHZ 100% 1.0 W/cm2    Ultrasound Goals  Edema;Pain      Manual Therapy   Manual Therapy  Soft tissue mobilization  Soft tissue mobilization  bilat. gastroc and left plantar fascia including IASTM       Trigger Point Dry Needling - 08/18/18 1303    Consent Given?  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    Baseline  holding off on running for now    Time  3    Period  Weeks    Status  On-going        PT Long Term Goals - 08/18/18 1307      PT  [place] #1   Title  Decrease pain with running at least 75% from current status 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    Baseline  not met    Time  6    Period  Weeks    Status  On-going      PT LONG TERM GOAL #2   Title  Left hip strength 5/5 for abduction and extension, right gastroc strength 5/5 to improve gait/running mechanics to decrease heel pain (now include right gastroc strength 5/5)    Baseline  met for extension but not abduction, gastroc not tested    Time  6    Period  Weeks    Status  On-going      PT LONG TERM GOAL #3   Title  Tolerate standing/walking for household IADLs and community ambulation, shopping without limitation due to right gastrocnemius pain    Baseline  met    Time  6    Period  Weeks    Status  Achieved            Plan - 08/18/18 1305    Clinical Impression Statement  Improving with tolerance day-to-day mobility for community ambulation with decreased gastroc pain s/p partial tear but still limited in ability more involved/prolonged activities and running which 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    PT Frequency  2x / week    PT Duration  6 weeks    PT Treatment/Interventions  Iontophoresis '4mg'$ /ml Dexamethasone;Cryotherapy;Electrical Stimulation;Ultrasound;Dry needling;Gait training;Functional mobility training;Moist Heat;Therapeutic activities;Therapeutic exercise;Balance training;Patient/family education;Manual techniques;Vasopneumatic Device;Taping;Neuromuscular re-education    PT Next Visit Plan  ROM, modalities, gentle STM, activity progression pending pain    PT Home Exercise Plan  heel raises, gentle stretches    Consulted and Agree with Plan of Care  Patient       Patient will benefit from skilled therapeutic intervention in order to improve the following deficits and impairments:  Abnormal gait, Pain, Increased edema, Difficulty walking, Decreased range of motion, Decreased strength,  Decreased activity tolerance, Decreased endurance  Visit Diagnosis: Pain in right lower leg  Localized edema  Difficulty  in walking, not elsewhere classified  Pain in left ankle and joints of left foot     Problem List Patient Active Problem List   Diagnosis Date Noted  . Acute tonsillitis 01/08/2017  . Tonsillitis 01/08/2017  . Plantar fasciitis, left 10/29/2016  . Normal labor 08/08/2015  . Postpartum care following vaginal delivery (11/29) 08/08/2015  . 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 ENTHESOPATHY OF HIP REGION 02/12/2010  . Pain in joint, ankle and foot 12/22/2008  . PES PLANUS 12/22/2008  . UNEQUAL LEG LENGTH 12/22/2008  . ACHILLES TENDON TEAR 12/22/2008    Beaulah Dinning, PT, DPT 08/18/18 1:09 PM  [facility] [address] [place], [place], [address] Phone: [phone]   Fax:  [phone]  Name: Rebecca Ware MRN: [id number] Date of Birth: 03-05-1981

## 2018-08-21 ENCOUNTER — Encounter: Payer: Self-pay | Admitting: Physical Therapy

## 2018-08-21 ENCOUNTER — Ambulatory Visit: Payer: BLUE CROSS/BLUE SHIELD | Admitting: Physical Therapy

## 2018-08-21 DIAGNOSIS — M25572 Pain in left ankle and joints of left foot: Secondary | ICD-10-CM | POA: Diagnosis not present

## 2018-08-21 DIAGNOSIS — R600 Localized edema: Secondary | ICD-10-CM | POA: Diagnosis not present

## 2018-08-21 DIAGNOSIS — M79661 Pain in right lower leg: Secondary | ICD-10-CM

## 2018-08-21 DIAGNOSIS — R262 Difficulty in walking, not elsewhere classified: Secondary | ICD-10-CM

## 2018-08-21 NOTE — Therapy (Signed)
[place], [place], [address] Phone: [phone]   Fax:  [phone]  Physical Therapy Treatment  Patient Details  Name: Rebecca Ware MRN: [id number] Date of Birth: 1981/07/19 Referring Provider (PT): Rhina Brackett, MD   Encounter Date: 08/21/2018  PT End of Session - 08/21/18 1245    Visit Number  10    Number of Visits  14    Date for PT Re-Evaluation  09/10/18    Authorization Type  BCBS    PT Start Time  1105    PT Stop Time  1152   estim R gastroc during STM L side, no charge estim today-removed due to pain/pt. request   PT Time Calculation (min)  47 min    Activity Tolerance  Patient tolerated treatment well   estim held due to soreness after initial application otherwise session well-tolerated   Behavior During Therapy  [facility] for tasks assessed/performed       Past Medical History:  Diagnosis Date  . 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 Elevated blood pressure reading    x 6 mos. - no current med.  . Panic attacks   . Staph skin infection 01/06/2017   face - started antibiotic 2 weeks ago  . TMJ syndrome     Past Surgical History:  Procedure Laterality Date  . COLPOSCOPY  2011  . DILATION AND EVACUATION  03/29/2011   Procedure: DILATATION AND EVACUATION (D&E);  Surgeon: Princess Bruins;  Location: [facility];  Service: Gynecology;  Laterality: N/A;  . DILATION AND EVACUATION  07/08/2011   Procedure: DILATATION AND EVACUATION (D&E);  Surgeon: Lovenia Kim, MD;  Location: [facility];  Service: Gynecology;  Laterality: N/A;  . LAPAROSCOPY N/A 01/21/2013   Procedure: LAPAROSCOPY DIAGNOSTIC;  Surgeon: Rolm Bookbinder, MD;  Location: [facility];  Service: General;  Laterality: N/A;  . STRABISMUS SURGERY Bilateral 1993  . TONSILLECTOMY Bilateral 01/08/2017   Procedure: TONSILLECTOMY;  Surgeon: Jerrell Belfast, MD;  Location: [place] [facility] [place];  Service: ENT;  Laterality:  Bilateral;    There were no vitals filed for this visit.  Subjective Assessment - 08/21/18 1241    Subjective  (Right) calf feeling better but still not running. Discussed trial running short distances with advice to stop or hold back if too painful. Left foot doing well also.    Currently in Pain?  No/denies    Pain Score  0-No pain                       [facility] Adult PT Treatment/Exercise - 08/21/18 0001      Electrical Stimulation   Electrical Stimulation Location  Right medial gastroc    Electrical Stimulation Action  TENS    Electrical Stimulation Parameters  100 pps to tolerance   applied via needles, estim removed after 5 min due to pain     Ultrasound   Ultrasound Location  Right gastrocnemius-lower medial portion    Ultrasound Parameters  1 MHZ 100% 1.0 W/cm2    Ultrasound Goals  Edema;Pain      Manual Therapy   Manual Therapy  Soft tissue mobilization    Soft tissue mobilization  bilat. gastroc focus trigger points medial gastroc as well as IASTM to left foot performed during estim      Ankle Exercises: Stretches   Gastroc Stretch  3 reps;30 seconds   bilat. supine manual stretches      Trigger Point Dry Needling - 08/21/18  1244    Consent Given?  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    Baseline  holding off on running for now    Time  3    Period  Weeks     Status  On-going        PT Long Term Goals - 08/18/18 1307      PT [place] #1   Title  Decrease pain with running at least 75% from current status 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    Baseline  not met    Time  6    Period  Weeks    Status  On-going      PT LONG TERM GOAL #2   Title  Left hip strength 5/5 for abduction and extension, right gastroc strength 5/5 to improve gait/running mechanics to decrease heel pain (now include right gastroc strength 5/5)    Baseline  met for extension but not abduction, gastroc not tested    Time  6    Period  Weeks    Status  On-going      PT LONG TERM GOAL #3   Title  Tolerate standing/walking for household IADLs and community ambulation, shopping without limitation due to right gastrocnemius pain    Baseline  met    Time  6    Period  Weeks    Status  Achieved            Plan - 08/21/18 1246    Clinical Impression Statement  Functionally improving with walking tolerance but has not "tested" with running recently-discussed trial short distances to gauge tolerance so will await further status with this.    PT Frequency  2x / week    PT Duration  6 weeks    PT Treatment/Interventions  Iontophoresis '4mg'$ /ml Dexamethasone;Cryotherapy;Electrical Stimulation;Ultrasound;Dry needling;Gait training;Functional mobility training;Moist Heat;Therapeutic activities;Therapeutic exercise;Balance training;Patient/family education;Manual techniques;Vasopneumatic Device;Taping;Neuromuscular re-education    PT Next Visit Plan  ROM, modalities, gentle STM, activity progression pending pain    PT Home Exercise Plan  heel raises, gentle stretches    Consulted and Agree with Plan of Care  Patient       Patient will benefit from skilled therapeutic intervention in order to improve the following deficits and impairments:  Abnormal gait, Pain, Increased edema, Difficulty walking, Decreased range of motion, Decreased strength,  Decreased activity tolerance, Decreased endurance  Visit Diagnosis: Pain in right lower leg  Localized edema  Difficulty in walking, not elsewhere classified  Pain in left ankle and joints of left foot     Problem List Patient Active Problem List   Diagnosis Date Noted  . Acute  tonsillitis 01/08/2017  . Tonsillitis 01/08/2017  . Plantar fasciitis, left 10/29/2016  . Normal labor 08/08/2015  . Postpartum care following vaginal delivery (11/29) 08/08/2015  . 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 ENTHESOPATHY OF HIP REGION 02/12/2010  . Pain in joint, ankle and foot 12/22/2008  . PES PLANUS 12/22/2008  . UNEQUAL LEG LENGTH 12/22/2008  . ACHILLES TENDON TEAR 12/22/2008    Beaulah Dinning, PT, DPT 08/21/18 12:49 PM  [facility] [address] [place], [place], [address] Phone: [phone]   Fax:  [phone]  Name: Rebecca Ware MRN: [id number] Date of Birth: 1981/03/23

## 2018-08-25 ENCOUNTER — Ambulatory Visit: Payer: BLUE CROSS/BLUE SHIELD | Admitting: Physical Therapy

## 2018-08-25 ENCOUNTER — Encounter: Payer: Self-pay | Admitting: Physical Therapy

## 2018-08-25 DIAGNOSIS — M79661 Pain in right lower leg: Secondary | ICD-10-CM

## 2018-08-25 DIAGNOSIS — M25572 Pain in left ankle and joints of left foot: Secondary | ICD-10-CM | POA: Diagnosis not present

## 2018-08-25 DIAGNOSIS — R600 Localized edema: Secondary | ICD-10-CM

## 2018-08-25 DIAGNOSIS — R262 Difficulty in walking, not elsewhere classified: Secondary | ICD-10-CM | POA: Diagnosis not present

## 2018-08-25 NOTE — Therapy (Signed)
[place], [place], [address] Phone: [phone]   Fax:  [phone]  Physical Therapy Treatment  Patient Details  Name: Rebecca Ware MRN: [id number] Date of Birth: 1980/12/26 Referring Provider (PT): Rhina Brackett, MD   Encounter Date: 08/25/2018  PT End of Session - 08/25/18 1250    Visit Number  11    Number of Visits  14    Date for PT Re-Evaluation  09/10/18    Authorization Type  BCBS    PT Start Time  1103    PT Stop Time  1141    PT Time Calculation (min)  38 min    Activity Tolerance  Patient tolerated treatment well    Behavior During Therapy  [facility] for tasks assessed/performed       Past Medical History:  Diagnosis Date  . 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 Elevated blood pressure reading    x 6 mos. - no current med.  . Panic attacks   . Staph skin infection 01/06/2017   face - started antibiotic 2 weeks ago  . TMJ syndrome     Past Surgical History:  Procedure Laterality Date  . COLPOSCOPY  2011  . DILATION AND EVACUATION  03/29/2011   Procedure: DILATATION AND EVACUATION (D&E);  Surgeon: Princess Bruins;  Location: [place] [facility];  Service: Gynecology;  Laterality: N/A;  . DILATION AND EVACUATION  07/08/2011   Procedure: DILATATION AND EVACUATION (D&E);  Surgeon: Lovenia Kim, MD;  Location: [facility] [facility];  Service: Gynecology;  Laterality: N/A;  . LAPAROSCOPY N/A 01/21/2013   Procedure: LAPAROSCOPY DIAGNOSTIC;  Surgeon: Rolm Bookbinder, MD;  Location: [facility];  Service: General;  Laterality: N/A;  . STRABISMUS SURGERY Bilateral 1993  . TONSILLECTOMY Bilateral 01/08/2017   Procedure: TONSILLECTOMY;  Surgeon: Jerrell Belfast, MD;  Location: [place];  Service: ENT;  Laterality: Bilateral;    There were no vitals filed for this visit.  Subjective Assessment - 08/25/18 1244    Subjective  More sore after last tx. so did not try running over the weekend.  Discussed hold dry needling today due to soreness.    Currently in Pain?  No/denies         [facility] PT Assessment - 08/25/18 0001      Strength   Right Ankle Dorsiflexion  5/5    Right Ankle Plantar Flexion  4/5    Right Ankle Inversion  5/5    Right Ankle Eversion  5/5                   [facility] Adult PT Treatment/Exercise - 08/25/18 0001      Ultrasound   Ultrasound Location  Right gastrocnemius    Ultrasound Parameters  1 MHZ 100% 1.0 W/cm2    Ultrasound Goals  Edema;Pain      Manual Therapy   Manual Therapy  Soft tissue mobilization    Soft tissue mobilization  Right gastrocnemius      Ankle Exercises: Stretches   Slant Board Stretch  3 reps;30 seconds      Ankle Exercises: Supine   T-Band  T-band ankle 4-way x 20 ea. with Green Theraband      Ankle Exercises: Standing   Vector Stance  --   3-way reaches from RLE on Theraband pad 2x6   Heel Raises  --   3x10 with right weightshift   Other Standing Ankle Exercises  pall off press from [employer] blue band x 15 ea. 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    Baseline  plan trial later this week    Time  3    Period  Weeks    Status  On-going        PT Long Term Goals - 08/25/18 1252      PT LONG TERM GOAL #1   Title  Decrease pain with running at least 75% from current status 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    Baseline  not met    Time  6    Period  Weeks    Status  On-going      PT LONG TERM GOAL #2   Title   Left hip strength 5/5 for abduction and extension, right gastroc strength 5/5 to improve gait/running mechanics to decrease heel pain (now include right gastroc strength 5/5)    Baseline  met for extension but not abduction, gastroc 4/5    Time  6    Period  Weeks    Status  On-going      PT LONG TERM GOAL #3   Title  Tolerate standing/walking for household IADLs and community ambulation, shopping without limitation due to right gastrocnemius pain    Baseline  met    Time  6    Period  Weeks    Status  Achieved            Plan - 08/25/18 1250    Clinical Impression Statement  Included focus exercise progression today for right ankle strengthening and stabilization-strength progressing well from baseline-will await further status with planned trial running later this week.    Rehab Potential  Good    Clinical Impairments Affecting Rehab Potential  good tissue healing potential with respect to age and health of patient    PT Frequency  2x / week    PT Duration  6 weeks    PT Treatment/Interventions  Iontophoresis '4mg'$ /ml Dexamethasone;Cryotherapy;Electrical Stimulation;Ultrasound;Dry needling;Gait training;Functional mobility training;Moist Heat;Therapeutic activities;Therapeutic exercise;Balance training;Patient/family education;Manual techniques;Vasopneumatic Device;Taping;Neuromuscular re-education    PT Next Visit Plan  ROM, modalities, gentle STM, activity progression pending pain    PT Home Exercise Plan  heel raises, gentle stretches    Consulted and Agree with Plan of Care  Patient       Patient will benefit from skilled therapeutic intervention in order to improve the following deficits and impairments:  Abnormal gait, Pain, Increased edema, Difficulty walking, Decreased range of motion, Decreased strength, Decreased activity tolerance, Decreased endurance  Visit Diagnosis: Pain in right lower leg  Localized edema  Difficulty in walking, not elsewhere classified  Pain  in left ankle and joints of left foot     Problem List Patient Active Problem List   Diagnosis Date Noted  . Acute tonsillitis 01/08/2017  . Tonsillitis 01/08/2017  . Plantar fasciitis, left 10/29/2016  .  Normal labor 08/08/2015  . Postpartum care following vaginal delivery (11/29) 08/08/2015  . 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 ENTHESOPATHY OF HIP REGION 02/12/2010  . Pain in joint, ankle and foot 12/22/2008  . PES PLANUS 12/22/2008  . UNEQUAL LEG LENGTH 12/22/2008  . ACHILLES TENDON TEAR 12/22/2008    Beaulah Dinning, PT, DPT 08/25/18 12:54 PM  [facility] [address] [place], [place], [address] Phone: [phone]   Fax:  [phone]  Name: Rebecca Ware MRN: [id number] Date of Birth: 12/19/1980

## 2018-08-28 ENCOUNTER — Ambulatory Visit: Payer: BLUE CROSS/BLUE SHIELD | Admitting: Physical Therapy

## 2018-09-01 ENCOUNTER — Encounter

## 2018-09-28 NOTE — Therapy (Signed)
[place], [place], [address] Phone: [phone]   Fax:  [phone]  Physical Therapy Treatment/Discharge Summary  Patient Details  Name: Rebecca Ware MRN: [id number] Date of Birth: [birth year] Referring Provider (PT): Rhina Brackett, MD   Encounter Date: 08/25/2018    Past Medical History:  Diagnosis Date  . 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 Elevated blood pressure reading    x 6 mos. - no current med.  . Panic attacks   . Staph skin infection 01/06/2017   face - started antibiotic 2 weeks ago  . TMJ syndrome     Past Surgical History:  Procedure Laterality Date  . COLPOSCOPY  2011  . DILATION AND EVACUATION  03/29/2011   Procedure: DILATATION AND EVACUATION (D&E);  Surgeon: Princess Bruins;  Location: [facility];  Service: Gynecology;  Laterality: N/A;  . DILATION AND EVACUATION  07/08/2011   Procedure: DILATATION AND EVACUATION (D&E);  Surgeon: Lovenia Kim, MD;  Location: [facility];  Service: Gynecology;  Laterality: N/A;  . LAPAROSCOPY N/A 01/21/2013   Procedure: LAPAROSCOPY DIAGNOSTIC;  Surgeon: Rolm Bookbinder, MD;  Location: [facility];  Service: General;  Laterality: N/A;  . STRABISMUS SURGERY Bilateral 1993  . TONSILLECTOMY Bilateral 01/08/2017   Procedure: TONSILLECTOMY;  Surgeon: Jerrell Belfast, MD;  Location: [place];  Service: ENT;  Laterality: Bilateral;    There were no vitals filed for this visit.                              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    Baseline  not met    Time  6    Period  Weeks    Status  On-going      PT LONG TERM GOAL #2   Title  Left hip strength 5/5 for abduction and extension, right gastroc strength 5/5 to improve gait/running mechanics to decrease heel pain (now include right gastroc strength 5/5)    Baseline  met for extension but not abduction, gastroc 4/5    Time  6    Period  Weeks    Status  On-going      PT LONG TERM GOAL #3   Title  Tolerate standing/walking for household IADLs and community ambulation, shopping without limitation due to right gastrocnemius pain    Baseline  met    Time  6    Period  Weeks    Status  Achieved              Patient will benefit from skilled therapeutic  intervention in order to improve the following deficits and impairments:  Abnormal gait, Pain, Increased edema, Difficulty walking, Decreased range of motion, Decreased strength, Decreased activity tolerance, Decreased endurance  Visit Diagnosis: Pain in right lower leg  Localized edema  Difficulty in walking, not elsewhere classified  Pain in left ankle and joints of left foot     Problem List Patient Active Problem List   Diagnosis Date Noted  . Acute tonsillitis 01/08/2017  . Tonsillitis 01/08/2017  . Plantar fasciitis, left 10/29/2016  . Normal labor 08/08/2015  . Postpartum care following vaginal delivery (11/29) 08/08/2015  . 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 ENTHESOPATHY OF HIP REGION 02/12/2010  . Pain in joint, ankle and foot 12/22/2008  . PES PLANUS 12/22/2008  . UNEQUAL LEG LENGTH 12/22/2008  . ACHILLES TENDON TEAR 12/22/2008        PHYSICAL THERAPY DISCHARGE SUMMARY  Visits from Start of Care: 11  Current functional level related to goals  / functional outcomes: Patient did not return for further therapy after visit 08/25/18 and cancelled last visit-she reports doing well/no pain or limitations so no further therapy planned at this time.   Remaining deficits: NA   Education / Equipment: NA Plan: Patient agrees to discharge.  Patient goals were not met. Patient is being discharged due to being pleased with the current functional level.  ?????          Beaulah Dinning, PT, DPT 09/28/18 12:04 PM        [facility] [address] [place], [place], [address] Phone: [phone]   Fax:  [phone]  Name: Rebecca Ware MRN: [id number] Date of Birth: 29-Oct-1980

## 2019-04-08 DIAGNOSIS — F418 Other specified anxiety disorders: Secondary | ICD-10-CM | POA: Diagnosis not present

## 2019-04-08 DIAGNOSIS — L709 Acne, unspecified: Secondary | ICD-10-CM | POA: Diagnosis not present

## 2019-06-07 DIAGNOSIS — N6320 Unspecified lump in the left breast, unspecified quadrant: Secondary | ICD-10-CM | POA: Diagnosis not present

## 2019-06-22 DIAGNOSIS — L739 Follicular disorder, unspecified: Secondary | ICD-10-CM | POA: Diagnosis not present

## 2019-06-22 DIAGNOSIS — L709 Acne, unspecified: Secondary | ICD-10-CM | POA: Diagnosis not present

## 2019-07-05 ENCOUNTER — Other Ambulatory Visit: Payer: Self-pay

## 2019-07-05 DIAGNOSIS — Z20822 Contact with and (suspected) exposure to covid-19: Secondary | ICD-10-CM

## 2019-07-07 LAB — NOVEL CORONAVIRUS, NAA: SARS-CoV-2, NAA: NOT DETECTED

## 2019-07-16 DIAGNOSIS — J209 Acute bronchitis, unspecified: Secondary | ICD-10-CM | POA: Diagnosis not present

## 2019-07-30 DIAGNOSIS — Z130 Encounter for screening for diseases of the blood and blood-forming organs and certain disorders involving the immune mechanism: Secondary | ICD-10-CM | POA: Diagnosis not present

## 2019-07-30 DIAGNOSIS — Z6820 Body mass index (BMI) 20.0-20.9, adult: Secondary | ICD-10-CM | POA: Diagnosis not present

## 2019-07-30 DIAGNOSIS — Z01419 Encounter for gynecological examination (general) (routine) without abnormal findings: Secondary | ICD-10-CM | POA: Diagnosis not present

## 2019-07-30 DIAGNOSIS — Z1151 Encounter for screening for human papillomavirus (HPV): Secondary | ICD-10-CM | POA: Diagnosis not present

## 2019-07-30 DIAGNOSIS — Z1231 Encounter for screening mammogram for malignant neoplasm of breast: Secondary | ICD-10-CM | POA: Diagnosis not present

## 2019-07-30 DIAGNOSIS — Z0000 Encounter for general adult medical examination without abnormal findings: Secondary | ICD-10-CM | POA: Diagnosis not present

## 2019-07-30 DIAGNOSIS — Z13220 Encounter for screening for lipoid disorders: Secondary | ICD-10-CM | POA: Diagnosis not present

## 2019-07-30 DIAGNOSIS — Z1329 Encounter for screening for other suspected endocrine disorder: Secondary | ICD-10-CM | POA: Diagnosis not present

## 2019-08-03 ENCOUNTER — Other Ambulatory Visit: Payer: Self-pay

## 2019-08-03 DIAGNOSIS — Z20822 Contact with and (suspected) exposure to covid-19: Secondary | ICD-10-CM

## 2019-08-05 LAB — NOVEL CORONAVIRUS, NAA: SARS-CoV-2, NAA: NOT DETECTED

## 2020-06-14 DIAGNOSIS — Z1159 Encounter for screening for other viral diseases: Secondary | ICD-10-CM | POA: Diagnosis not present

## 2020-07-31 DIAGNOSIS — B09 Unspecified viral infection characterized by skin and mucous membrane lesions: Secondary | ICD-10-CM | POA: Diagnosis not present

## 2020-07-31 DIAGNOSIS — R790 Abnormal level of blood mineral: Secondary | ICD-10-CM | POA: Diagnosis not present

## 2020-07-31 DIAGNOSIS — K30 Functional dyspepsia: Secondary | ICD-10-CM | POA: Diagnosis not present

## 2020-07-31 DIAGNOSIS — R198 Other specified symptoms and signs involving the digestive system and abdomen: Secondary | ICD-10-CM | POA: Diagnosis not present

## 2020-08-02 ENCOUNTER — Other Ambulatory Visit: Payer: Self-pay | Admitting: Family Medicine

## 2020-08-02 DIAGNOSIS — R1310 Dysphagia, unspecified: Secondary | ICD-10-CM

## 2020-08-18 ENCOUNTER — Other Ambulatory Visit: Payer: BLUE CROSS/BLUE SHIELD

## 2020-08-30 ENCOUNTER — Other Ambulatory Visit: Payer: BC Managed Care – PPO

## 2020-09-21 DIAGNOSIS — Z91011 Allergy to milk products: Secondary | ICD-10-CM | POA: Diagnosis not present

## 2020-09-21 DIAGNOSIS — F418 Other specified anxiety disorders: Secondary | ICD-10-CM | POA: Diagnosis not present

## 2020-09-21 DIAGNOSIS — T50B95A Adverse effect of other viral vaccines, initial encounter: Secondary | ICD-10-CM | POA: Diagnosis not present

## 2020-12-20 DIAGNOSIS — Z1231 Encounter for screening mammogram for malignant neoplasm of breast: Secondary | ICD-10-CM | POA: Diagnosis not present

## 2020-12-20 DIAGNOSIS — Z124 Encounter for screening for malignant neoplasm of cervix: Secondary | ICD-10-CM | POA: Diagnosis not present

## 2020-12-20 DIAGNOSIS — N920 Excessive and frequent menstruation with regular cycle: Secondary | ICD-10-CM | POA: Diagnosis not present

## 2021-01-11 ENCOUNTER — Other Ambulatory Visit: Payer: Self-pay

## 2021-01-11 ENCOUNTER — Ambulatory Visit: Payer: BC Managed Care – PPO | Admitting: Sports Medicine

## 2021-01-11 VITALS — BP 116/72 | Ht 62.0 in

## 2021-01-11 DIAGNOSIS — M2142 Flat foot [pes planus] (acquired), left foot: Secondary | ICD-10-CM | POA: Diagnosis not present

## 2021-01-11 DIAGNOSIS — M2141 Flat foot [pes planus] (acquired), right foot: Secondary | ICD-10-CM | POA: Diagnosis not present

## 2021-01-11 DIAGNOSIS — M19079 Primary osteoarthritis, unspecified ankle and foot: Secondary | ICD-10-CM

## 2021-01-11 DIAGNOSIS — Y9302 Activity, running: Secondary | ICD-10-CM

## 2021-01-11 NOTE — Progress Notes (Signed)
   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  Patient has a history of bilateral foot pain and pes planus, as well as left leg length discrepancy, which has previously been controlled with custom orthotics.  Recently, she started to notice pain in her left midfoot which is worse after aggressive runs.  She is hoping a new pair of orthotics will correct this.  No new trauma.  No significant or abnormal increases in her training.              ROS:   All other systems were reviewed and are negative.  Objective: Vital Signs: BP 116/72   Ht 5\' 2"  (1.575 m)   BMI 19.02 kg/m  No flowsheet data found.   No flowsheet data found.  Physical Exam:  General:  Alert and oriented, in no acute distress. Pulm:  Breathing unlabored. Psy:  Normal mood, congruent affect. Skin: Bilateral feet without bruises, rashes, or erythema. Overlying skin intact.  Bilateral feet with no swelling, deformity, or bruising. Bilateral - moderate long arches; flattening of transverse arch > on left   Appropriate posterior tibialis function with heel rise. Tenderness to palpation over the left first TMT joint.  No tenderness elsewhere in the midfoot.  No tenderness along fifth metatarsal.  Running gait assessment demonstrates forefoot strike.   Imaging: Limited extremity ultrasound: Left midfoot Left TMT joint 1 shows small effusion with spurring.   Assessment & Plan: 40 year old female presenting to clinic with bilateral transverse arch flattening and first TMT pain.  Fit for custom orthotics as described below, which patient stated were very comfortable.  Neutral gait after custom orthotics in place. -Return to clinic  if needed for orthotic adjustments. -Patient had no further questions or concerns today.     Procedures: Patient was fitted for a : standard, cushioned, semi-rigid orthotic. The orthotic was heated and afterward the patient stood on the orthotic blank positioned on the orthotic stand. The patient was positioned in subtalar neutral position and 10 degrees of ankle dorsiflexion in a weight bearing stance. After completion of molding, a stable base was applied to the orthotic blank. The blank was ground to a stable position for weight bearing. Size: 7 Medium EVA Red Base: Black Wedged Moderate density EVA Posting:None Additional orthotic padding: Small Scaphoid (with blue foam) Left  I observed and examined the patient with the SM resident and agree with assessment and plan.  Note reviewed and modified by me. 45, MD

## 2021-01-17 DIAGNOSIS — M79672 Pain in left foot: Secondary | ICD-10-CM | POA: Diagnosis not present

## 2021-01-17 DIAGNOSIS — M79661 Pain in right lower leg: Secondary | ICD-10-CM | POA: Diagnosis not present

## 2021-01-26 ENCOUNTER — Other Ambulatory Visit: Payer: Self-pay | Admitting: Orthopaedic Surgery

## 2021-01-26 DIAGNOSIS — M79672 Pain in left foot: Secondary | ICD-10-CM

## 2021-01-26 DIAGNOSIS — M25572 Pain in left ankle and joints of left foot: Secondary | ICD-10-CM

## 2021-01-29 ENCOUNTER — Other Ambulatory Visit: Payer: Self-pay | Admitting: Orthopaedic Surgery

## 2021-01-29 DIAGNOSIS — M25572 Pain in left ankle and joints of left foot: Secondary | ICD-10-CM

## 2021-01-29 DIAGNOSIS — M79672 Pain in left foot: Secondary | ICD-10-CM

## 2021-01-31 ENCOUNTER — Ambulatory Visit
Admission: RE | Admit: 2021-01-31 | Discharge: 2021-01-31 | Disposition: A | Discharge status: Home / Self Care | Payer: BC Managed Care – PPO | Source: Ambulatory Visit | Attending: Orthopaedic Surgery | Admitting: Orthopaedic Surgery

## 2021-01-31 ENCOUNTER — Other Ambulatory Visit: Payer: Self-pay

## 2021-01-31 DIAGNOSIS — M79672 Pain in left foot: Secondary | ICD-10-CM

## 2021-01-31 DIAGNOSIS — M25572 Pain in left ankle and joints of left foot: Secondary | ICD-10-CM

## 2021-01-31 DIAGNOSIS — M7989 Other specified soft tissue disorders: Secondary | ICD-10-CM | POA: Diagnosis not present

## 2021-01-31 DIAGNOSIS — M25472 Effusion, left ankle: Secondary | ICD-10-CM | POA: Diagnosis not present

## 2021-01-31 DIAGNOSIS — R600 Localized edema: Secondary | ICD-10-CM | POA: Diagnosis not present

## 2021-02-02 DIAGNOSIS — M79661 Pain in right lower leg: Secondary | ICD-10-CM | POA: Diagnosis not present

## 2021-02-06 ENCOUNTER — Other Ambulatory Visit: Payer: BC Managed Care – PPO

## 2021-02-14 DIAGNOSIS — M25572 Pain in left ankle and joints of left foot: Secondary | ICD-10-CM | POA: Diagnosis not present

## 2021-02-19 DIAGNOSIS — M25572 Pain in left ankle and joints of left foot: Secondary | ICD-10-CM | POA: Diagnosis not present

## 2021-02-26 DIAGNOSIS — M79672 Pain in left foot: Secondary | ICD-10-CM | POA: Diagnosis not present

## 2021-03-02 DIAGNOSIS — M79672 Pain in left foot: Secondary | ICD-10-CM | POA: Diagnosis not present

## 2021-03-05 DIAGNOSIS — M79672 Pain in left foot: Secondary | ICD-10-CM | POA: Diagnosis not present

## 2021-03-05 DIAGNOSIS — R2689 Other abnormalities of gait and mobility: Secondary | ICD-10-CM | POA: Diagnosis not present

## 2021-03-07 ENCOUNTER — Other Ambulatory Visit: Payer: Self-pay | Admitting: Orthopaedic Surgery

## 2021-03-07 DIAGNOSIS — M79672 Pain in left foot: Secondary | ICD-10-CM | POA: Diagnosis not present

## 2021-03-11 ENCOUNTER — Ambulatory Visit
Admission: RE | Admit: 2021-03-11 | Discharge: 2021-03-11 | Disposition: A | Discharge status: Home / Self Care | Payer: BC Managed Care – PPO | Source: Ambulatory Visit | Attending: Orthopaedic Surgery | Admitting: Orthopaedic Surgery

## 2021-03-11 DIAGNOSIS — R600 Localized edema: Secondary | ICD-10-CM | POA: Diagnosis not present

## 2021-03-11 DIAGNOSIS — M79672 Pain in left foot: Secondary | ICD-10-CM

## 2021-03-20 ENCOUNTER — Other Ambulatory Visit: Payer: Self-pay | Admitting: Orthopaedic Surgery

## 2021-03-22 ENCOUNTER — Other Ambulatory Visit: Payer: Self-pay | Admitting: Orthopaedic Surgery

## 2021-03-22 DIAGNOSIS — M79672 Pain in left foot: Secondary | ICD-10-CM

## 2021-03-23 ENCOUNTER — Ambulatory Visit
Admission: RE | Admit: 2021-03-23 | Discharge: 2021-03-23 | Disposition: A | Discharge status: Home / Self Care | Payer: BC Managed Care – PPO | Source: Ambulatory Visit | Attending: Orthopaedic Surgery | Admitting: Orthopaedic Surgery

## 2021-03-23 DIAGNOSIS — M79672 Pain in left foot: Secondary | ICD-10-CM

## 2021-03-23 DIAGNOSIS — M19072 Primary osteoarthritis, left ankle and foot: Secondary | ICD-10-CM | POA: Diagnosis not present

## 2021-07-21 DIAGNOSIS — R059 Cough, unspecified: Secondary | ICD-10-CM | POA: Diagnosis not present

## 2021-07-21 DIAGNOSIS — H66001 Acute suppurative otitis media without spontaneous rupture of ear drum, right ear: Secondary | ICD-10-CM | POA: Diagnosis not present

## 2021-11-09 DIAGNOSIS — Z131 Encounter for screening for diabetes mellitus: Secondary | ICD-10-CM | POA: Diagnosis not present

## 2021-11-09 DIAGNOSIS — M19071 Primary osteoarthritis, right ankle and foot: Secondary | ICD-10-CM | POA: Diagnosis not present

## 2021-11-09 DIAGNOSIS — N951 Menopausal and female climacteric states: Secondary | ICD-10-CM | POA: Diagnosis not present

## 2021-11-09 DIAGNOSIS — F418 Other specified anxiety disorders: Secondary | ICD-10-CM | POA: Diagnosis not present

## 2021-11-09 DIAGNOSIS — Z13220 Encounter for screening for lipoid disorders: Secondary | ICD-10-CM | POA: Diagnosis not present

## 2021-11-09 DIAGNOSIS — R232 Flushing: Secondary | ICD-10-CM | POA: Diagnosis not present

## 2021-11-09 DIAGNOSIS — R790 Abnormal level of blood mineral: Secondary | ICD-10-CM | POA: Diagnosis not present

## 2022-04-24 DIAGNOSIS — R790 Abnormal level of blood mineral: Secondary | ICD-10-CM | POA: Diagnosis not present

## 2022-08-07 DIAGNOSIS — Z139 Encounter for screening, unspecified: Secondary | ICD-10-CM | POA: Diagnosis not present

## 2022-08-07 DIAGNOSIS — Z0000 Encounter for general adult medical examination without abnormal findings: Secondary | ICD-10-CM | POA: Diagnosis not present

## 2022-08-07 DIAGNOSIS — Z124 Encounter for screening for malignant neoplasm of cervix: Secondary | ICD-10-CM | POA: Diagnosis not present

## 2022-08-07 DIAGNOSIS — Z1231 Encounter for screening mammogram for malignant neoplasm of breast: Secondary | ICD-10-CM | POA: Diagnosis not present

## 2022-08-07 DIAGNOSIS — Z01419 Encounter for gynecological examination (general) (routine) without abnormal findings: Secondary | ICD-10-CM | POA: Diagnosis not present

## 2022-08-07 DIAGNOSIS — Z681 Body mass index (BMI) 19 or less, adult: Secondary | ICD-10-CM | POA: Diagnosis not present

## 2022-11-03 IMAGING — MR MR FOOT*L* W/O CM
5 series · 40 of 40 positions shown · non-contrast
Comparison: None.

CLINICAL DATA: Pain on top of left foot since running injury 2
months ago.

EXAM:
MRI OF THE LEFT FOOT WITHOUT CONTRAST
TECHNIQUE: Multiplanar, multisequence MR imaging of the left foot was
performed. No intravenous contrast was administered.

[Series 4: T1 · coronal · left · 3.0mm · 0.31mm/px · 10 of 44 slices shown (1 of 2)]
[im 1/44]
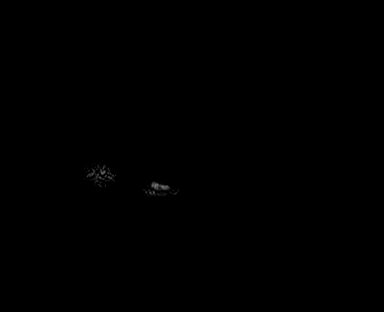
[im 5/44]
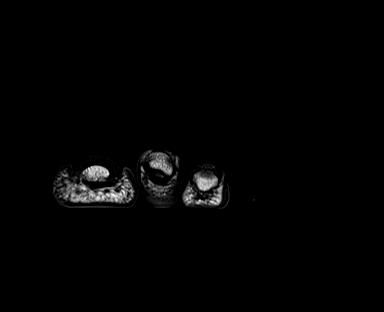
[im 10/44]
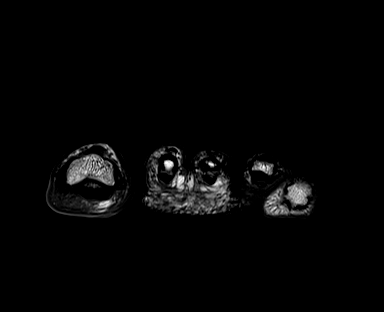
[im 15/44]
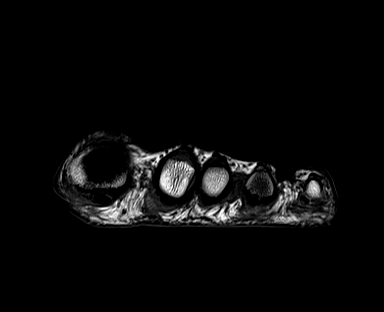
[im 20/44]
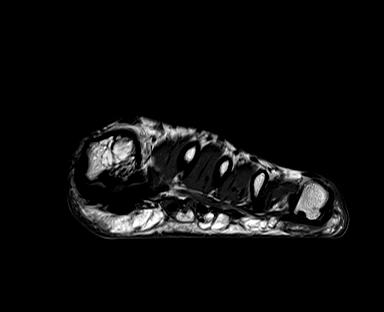
[im 24/44]
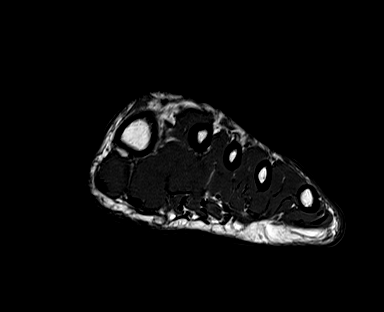
[im 29/44]
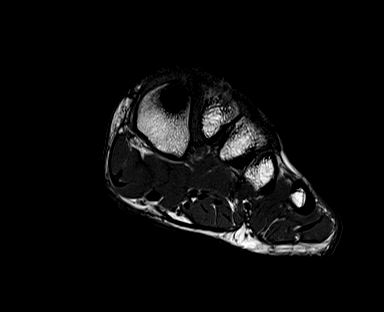
[im 34/44]
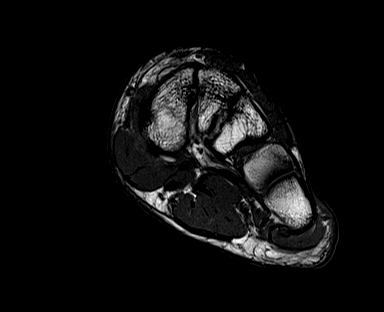
[im 39/44]
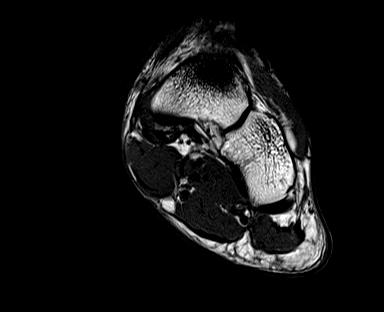
[im 44/44]
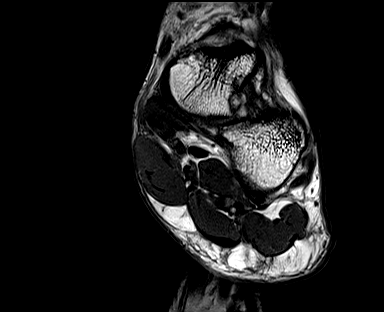

[Series 5: T2 fat-sat · coronal · left · 3.0mm · 0.31mm/px · 11 of 44 slices shown (1 of 2)]
[im 1/44]
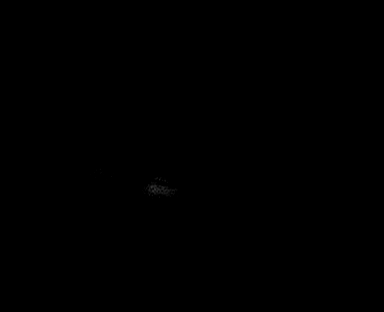
[im 5/44]
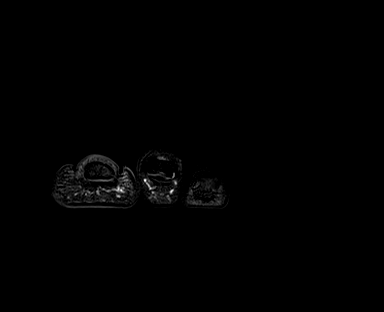
[im 9/44]
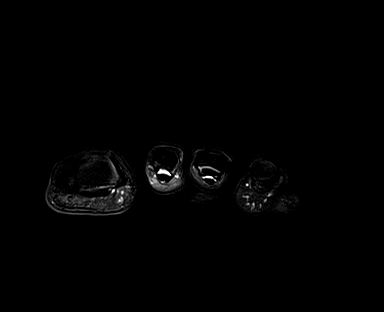
[im 13/44]
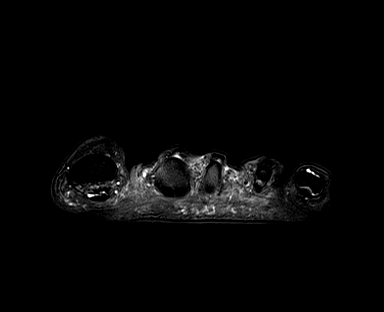
[im 18/44]
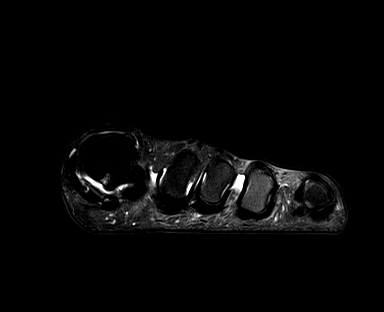
[im 22/44]
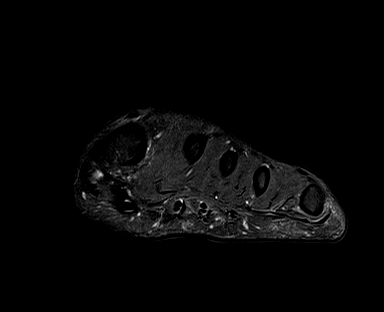
[im 26/44]
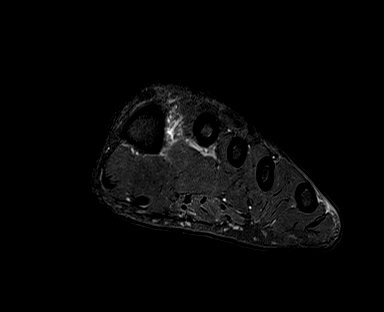
[im 31/44]
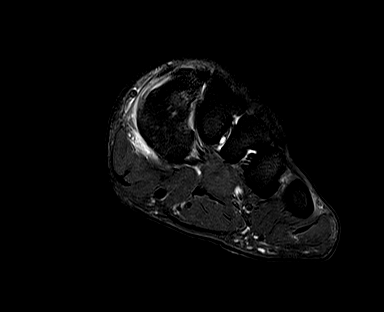
[im 35/44]
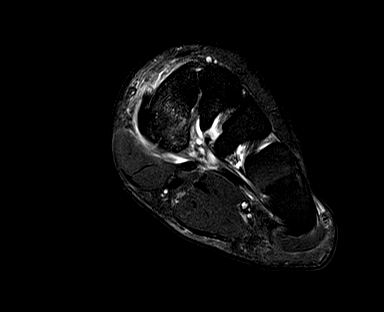
[im 39/44]
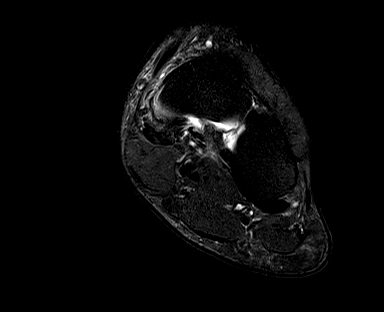
[im 44/44]
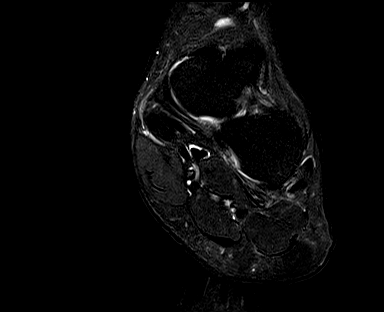

[Series 6: STIR · sagittal · left · 3.0mm · 0.70mm/px · 7 of 28 slices shown]
[im 1/28]
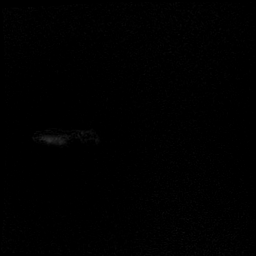
[im 5/28]
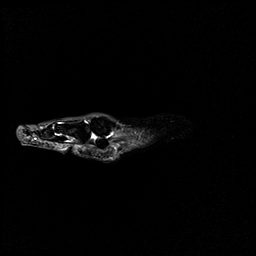
[im 10/28]
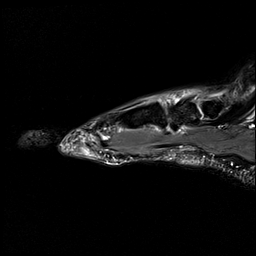
[im 14/28]
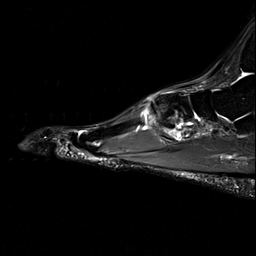
[im 19/28]
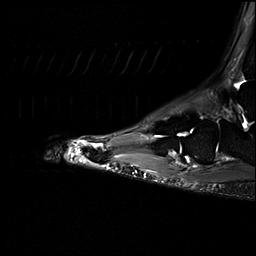
[im 23/28]
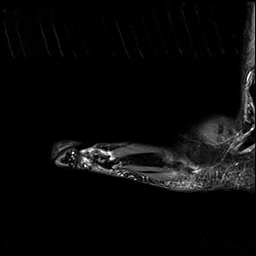
[im 28/28]
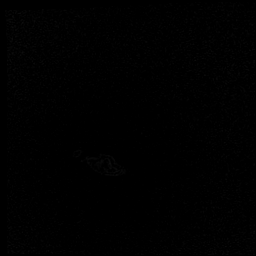

[Series 7: T1 · axial · left · 3.0mm · 0.47mm/px · z∈[-32,+49]mm · 6 of 23 slices shown (2 of 2)]
[im 1/23]
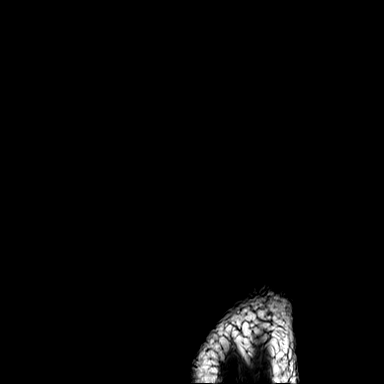
[im 5/23]
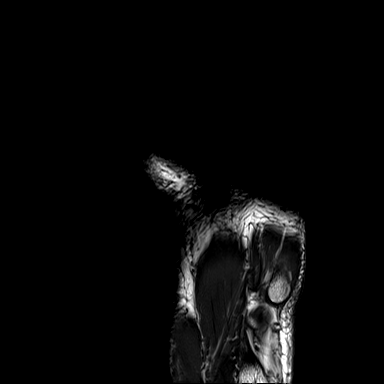
[im 9/23]
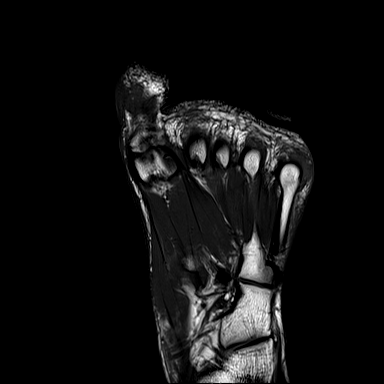
[im 14/23]
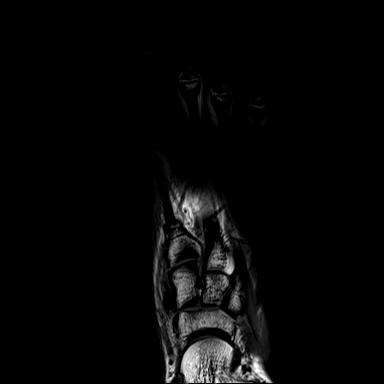
[im 18/23]
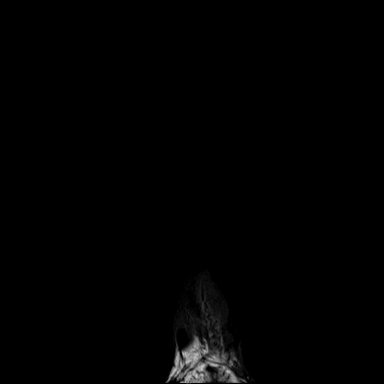
[im 23/23]
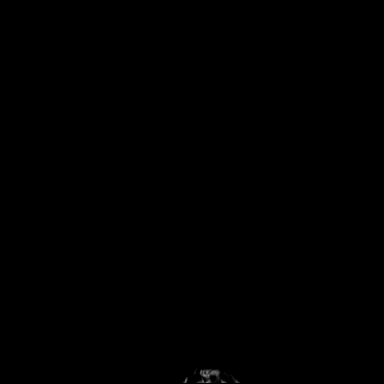

[Series 8: T2 fat-sat · axial · left · 3.0mm · 0.47mm/px · z∈[-32,+49]mm · 6 of 23 slices shown (2 of 2)]
[im 1/23]
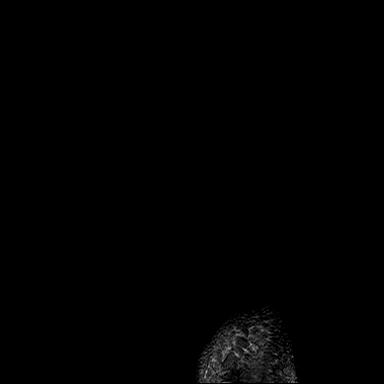
[im 5/23]
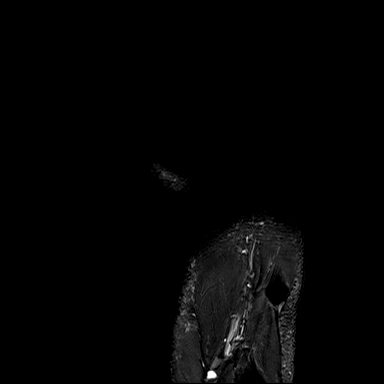
[im 9/23]
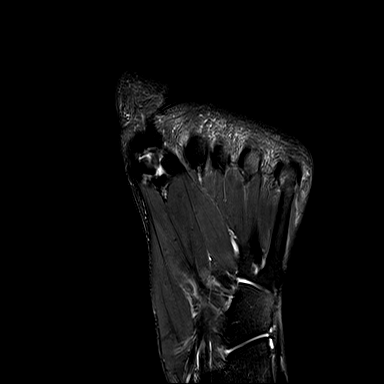
[im 14/23]
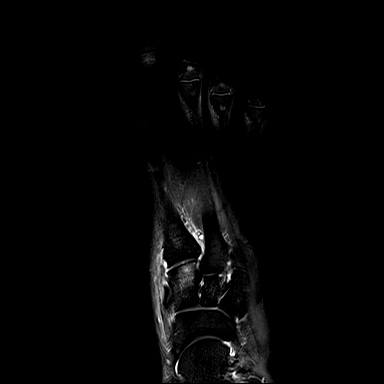
[im 18/23]
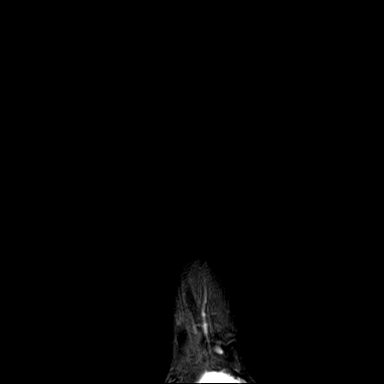
[im 23/23]
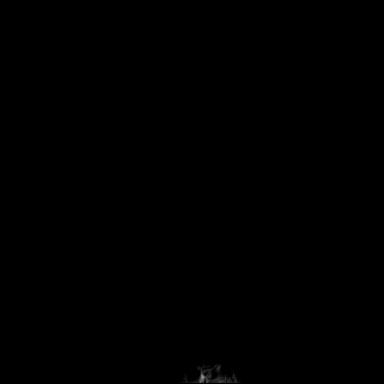

[40 of 40 positions shown; findings below may reference images not displayed]

FINDINGS: Bones/Joint/Cartilage

There is bony edema within the distal lateral aspect of the medial
cuneiform. There is adjacent soft tissue swelling along the medial
midfoot.

Ligaments

Intact Lisfranc ligament.  No evidence of plantar plate tear

Muscles and Tendons

No significant muscle atrophy. No acute tendon tear in the forefoot.

Soft tissues

No evidence of soft tissue mass.
IMPRESSION: Bony edema in the distal lateral aspect of the medial cuneiform
compatible with stress injury. Adjacent soft tissue swelling along
the medial midfoot.

## 2023-01-09 DIAGNOSIS — F418 Other specified anxiety disorders: Secondary | ICD-10-CM | POA: Diagnosis not present

## 2023-01-09 DIAGNOSIS — R790 Abnormal level of blood mineral: Secondary | ICD-10-CM | POA: Diagnosis not present

## 2023-01-09 DIAGNOSIS — Z1159 Encounter for screening for other viral diseases: Secondary | ICD-10-CM | POA: Diagnosis not present

## 2023-03-07 DIAGNOSIS — M76822 Posterior tibial tendinitis, left leg: Secondary | ICD-10-CM | POA: Diagnosis not present

## 2023-03-12 DIAGNOSIS — M76822 Posterior tibial tendinitis, left leg: Secondary | ICD-10-CM | POA: Diagnosis not present

## 2023-03-21 DIAGNOSIS — M25572 Pain in left ankle and joints of left foot: Secondary | ICD-10-CM | POA: Diagnosis not present

## 2023-04-03 DIAGNOSIS — M76822 Posterior tibial tendinitis, left leg: Secondary | ICD-10-CM | POA: Diagnosis not present

## 2023-04-03 DIAGNOSIS — M25572 Pain in left ankle and joints of left foot: Secondary | ICD-10-CM | POA: Diagnosis not present

## 2023-04-08 DIAGNOSIS — M25672 Stiffness of left ankle, not elsewhere classified: Secondary | ICD-10-CM | POA: Diagnosis not present

## 2023-04-08 DIAGNOSIS — M76822 Posterior tibial tendinitis, left leg: Secondary | ICD-10-CM | POA: Diagnosis not present

## 2023-04-08 DIAGNOSIS — R262 Difficulty in walking, not elsewhere classified: Secondary | ICD-10-CM | POA: Diagnosis not present

## 2023-04-08 DIAGNOSIS — M25572 Pain in left ankle and joints of left foot: Secondary | ICD-10-CM | POA: Diagnosis not present

## 2023-04-11 DIAGNOSIS — M25672 Stiffness of left ankle, not elsewhere classified: Secondary | ICD-10-CM | POA: Diagnosis not present

## 2023-04-11 DIAGNOSIS — M25572 Pain in left ankle and joints of left foot: Secondary | ICD-10-CM | POA: Diagnosis not present

## 2023-04-11 DIAGNOSIS — M76822 Posterior tibial tendinitis, left leg: Secondary | ICD-10-CM | POA: Diagnosis not present

## 2023-04-11 DIAGNOSIS — R262 Difficulty in walking, not elsewhere classified: Secondary | ICD-10-CM | POA: Diagnosis not present

## 2023-04-15 DIAGNOSIS — M25572 Pain in left ankle and joints of left foot: Secondary | ICD-10-CM | POA: Diagnosis not present

## 2023-04-15 DIAGNOSIS — M76822 Posterior tibial tendinitis, left leg: Secondary | ICD-10-CM | POA: Diagnosis not present

## 2023-04-15 DIAGNOSIS — R262 Difficulty in walking, not elsewhere classified: Secondary | ICD-10-CM | POA: Diagnosis not present

## 2023-04-15 DIAGNOSIS — M25672 Stiffness of left ankle, not elsewhere classified: Secondary | ICD-10-CM | POA: Diagnosis not present

## 2023-04-18 DIAGNOSIS — M25672 Stiffness of left ankle, not elsewhere classified: Secondary | ICD-10-CM | POA: Diagnosis not present

## 2023-04-18 DIAGNOSIS — M25572 Pain in left ankle and joints of left foot: Secondary | ICD-10-CM | POA: Diagnosis not present

## 2023-04-18 DIAGNOSIS — R262 Difficulty in walking, not elsewhere classified: Secondary | ICD-10-CM | POA: Diagnosis not present

## 2023-04-18 DIAGNOSIS — M76822 Posterior tibial tendinitis, left leg: Secondary | ICD-10-CM | POA: Diagnosis not present

## 2023-04-29 DIAGNOSIS — M79672 Pain in left foot: Secondary | ICD-10-CM | POA: Diagnosis not present

## 2023-04-29 DIAGNOSIS — M76822 Posterior tibial tendinitis, left leg: Secondary | ICD-10-CM | POA: Diagnosis not present

## 2023-04-29 DIAGNOSIS — M79671 Pain in right foot: Secondary | ICD-10-CM | POA: Diagnosis not present

## 2023-08-22 DIAGNOSIS — D2262 Melanocytic nevi of left upper limb, including shoulder: Secondary | ICD-10-CM | POA: Diagnosis not present

## 2023-08-22 DIAGNOSIS — L821 Other seborrheic keratosis: Secondary | ICD-10-CM | POA: Diagnosis not present

## 2023-08-22 DIAGNOSIS — D2261 Melanocytic nevi of right upper limb, including shoulder: Secondary | ICD-10-CM | POA: Diagnosis not present

## 2023-08-22 DIAGNOSIS — D2371 Other benign neoplasm of skin of right lower limb, including hip: Secondary | ICD-10-CM | POA: Diagnosis not present

## 2023-12-03 DIAGNOSIS — Z01419 Encounter for gynecological examination (general) (routine) without abnormal findings: Secondary | ICD-10-CM | POA: Diagnosis not present

## 2023-12-03 DIAGNOSIS — Z1231 Encounter for screening mammogram for malignant neoplasm of breast: Secondary | ICD-10-CM | POA: Diagnosis not present

## 2023-12-03 DIAGNOSIS — Z124 Encounter for screening for malignant neoplasm of cervix: Secondary | ICD-10-CM | POA: Diagnosis not present

## 2023-12-03 DIAGNOSIS — N920 Excessive and frequent menstruation with regular cycle: Secondary | ICD-10-CM | POA: Diagnosis not present

## 2024-02-24 DIAGNOSIS — Z6821 Body mass index (BMI) 21.0-21.9, adult: Secondary | ICD-10-CM | POA: Diagnosis not present

## 2024-02-24 DIAGNOSIS — Z1211 Encounter for screening for malignant neoplasm of colon: Secondary | ICD-10-CM | POA: Diagnosis not present

## 2024-02-24 DIAGNOSIS — R790 Abnormal level of blood mineral: Secondary | ICD-10-CM | POA: Diagnosis not present

## 2024-02-24 DIAGNOSIS — F418 Other specified anxiety disorders: Secondary | ICD-10-CM | POA: Diagnosis not present

## 2024-02-24 DIAGNOSIS — Z23 Encounter for immunization: Secondary | ICD-10-CM | POA: Diagnosis not present

## 2024-06-08 DIAGNOSIS — R790 Abnormal level of blood mineral: Secondary | ICD-10-CM | POA: Diagnosis not present
